# Patient Record
Sex: Male | Born: 1950 | Race: White | Hispanic: No | Marital: Married | Smoking: Former smoker
Health system: Southern US, Community
[De-identification: ages and names within clinical notes are randomized; demographics above are authoritative.]

## PROBLEM LIST (undated history)

## (undated) DIAGNOSIS — K219 Gastro-esophageal reflux disease without esophagitis: Secondary | ICD-10-CM

## (undated) DIAGNOSIS — E079 Disorder of thyroid, unspecified: Secondary | ICD-10-CM

## (undated) DIAGNOSIS — C3490 Malignant neoplasm of unspecified part of unspecified bronchus or lung: Secondary | ICD-10-CM

## (undated) DIAGNOSIS — I714 Abdominal aortic aneurysm, without rupture, unspecified: Secondary | ICD-10-CM

## (undated) DIAGNOSIS — J449 Chronic obstructive pulmonary disease, unspecified: Secondary | ICD-10-CM

## (undated) DIAGNOSIS — R972 Elevated prostate specific antigen [PSA]: Secondary | ICD-10-CM

## (undated) HISTORY — PX: HERNIA REPAIR: SHX51

## (undated) HISTORY — PX: LUNG CANCER SURGERY: SHX702

## (undated) MED ORDER — SPIRONOLACTONE 25 MG OR TABS
ORAL_TABLET | ORAL | 0 refills | Status: AC
Start: 2016-06-24 — End: ?

## (undated) MED ORDER — WARFARIN SODIUM 5 MG OR TABS
ORAL_TABLET | ORAL | Status: AC
Start: 2013-07-04 — End: ?

## (undated) MED ORDER — LISINOPRIL 20 MG OR TABS
ORAL_TABLET | ORAL | 0 refills | Status: AC
Start: 2016-06-24 — End: ?

## (undated) MED ORDER — LEVOTHYROXINE SODIUM 100 MCG OR TABS
100.0000 ug | ORAL_TABLET | Freq: Every day | ORAL | Status: AC
Start: 2017-10-15 — End: ?

## (undated) MED ORDER — XARELTO 20 MG OR TABS
ORAL_TABLET | ORAL | Status: AC
Start: 2015-08-07 — End: ?

## (undated) MED ORDER — LEVOTHYROXINE SODIUM 100 MCG OR TABS
ORAL_TABLET | ORAL | 0 refills | Status: AC
Start: 2016-10-22 — End: ?

## (undated) MED ORDER — AMIODARONE HCL 200 MG OR TABS
ORAL_TABLET | ORAL | 0 refills | Status: AC
Start: 2016-06-24 — End: ?

## (undated) MED ORDER — FUROSEMIDE 40 MG OR TABS
ORAL_TABLET | ORAL | Status: AC
Start: 2013-07-11 — End: ?

## (undated) MED ORDER — XARELTO 20 MG OR TABS
ORAL_TABLET | ORAL | 0 refills | Status: AC
Start: 2016-08-19 — End: ?

## (undated) MED ORDER — SPIRONOLACTONE 25 MG OR TABS
ORAL_TABLET | ORAL | 0 refills | Status: AC
Start: 2016-08-19 — End: ?

## (undated) MED ORDER — WARFARIN SODIUM 5 MG OR TABS
ORAL_TABLET | ORAL | Status: AC
Start: 2014-02-23 — End: ?

---

## 2007-06-12 ENCOUNTER — Ambulatory Visit (EMERGENCY_DEPARTMENT_HOSPITAL): Payer: Self-pay

## 2007-06-12 ENCOUNTER — Encounter (HOSPITAL_COMMUNITY): Payer: Self-pay

## 2007-06-12 DIAGNOSIS — J984 Other disorders of lung: Secondary | ICD-10-CM

## 2007-06-12 DIAGNOSIS — R222 Localized swelling, mass and lump, trunk: Secondary | ICD-10-CM

## 2007-06-12 DIAGNOSIS — I871 Compression of vein: Secondary | ICD-10-CM

## 2007-06-12 DIAGNOSIS — R0602 Shortness of breath: Secondary | ICD-10-CM

## 2007-06-12 DIAGNOSIS — R079 Chest pain, unspecified: Secondary | ICD-10-CM

## 2007-06-12 DIAGNOSIS — Z87891 Personal history of nicotine dependence: Secondary | ICD-10-CM

## 2007-06-12 DIAGNOSIS — J90 Pleural effusion, not elsewhere classified: Secondary | ICD-10-CM

## 2007-06-13 DIAGNOSIS — R222 Localized swelling, mass and lump, trunk: Secondary | ICD-10-CM

## 2007-06-15 ENCOUNTER — Other Ambulatory Visit (HOSPITAL_BASED_OUTPATIENT_CLINIC_OR_DEPARTMENT_OTHER): Payer: Self-pay | Admitting: Surgery

## 2007-06-15 DIAGNOSIS — C8589 Other specified types of non-Hodgkin lymphoma, extranodal and solid organ sites: Secondary | ICD-10-CM

## 2007-06-16 ENCOUNTER — Ambulatory Visit (HOSPITAL_BASED_OUTPATIENT_CLINIC_OR_DEPARTMENT_OTHER): Payer: Self-pay

## 2007-06-16 DIAGNOSIS — R222 Localized swelling, mass and lump, trunk: Secondary | ICD-10-CM

## 2007-06-16 DIAGNOSIS — R599 Enlarged lymph nodes, unspecified: Secondary | ICD-10-CM

## 2007-06-17 ENCOUNTER — Ambulatory Visit (EMERGENCY_DEPARTMENT_HOSPITAL): Payer: Medicaid Other

## 2007-06-17 ENCOUNTER — Encounter (HOSPITAL_COMMUNITY): Payer: Medicaid Other | Admitting: Infectious Disease

## 2007-06-17 DIAGNOSIS — C8589 Other specified types of non-Hodgkin lymphoma, extranodal and solid organ sites: Secondary | ICD-10-CM

## 2007-06-17 DIAGNOSIS — I871 Compression of vein: Secondary | ICD-10-CM

## 2007-06-18 ENCOUNTER — Other Ambulatory Visit (HOSPITAL_BASED_OUTPATIENT_CLINIC_OR_DEPARTMENT_OTHER): Payer: Self-pay | Admitting: Hematology & Oncology

## 2007-06-18 DIAGNOSIS — C8589 Other specified types of non-Hodgkin lymphoma, extranodal and solid organ sites: Secondary | ICD-10-CM

## 2007-06-18 DIAGNOSIS — C8582 Other specified types of non-Hodgkin lymphoma, intrathoracic lymph nodes: Secondary | ICD-10-CM

## 2007-06-20 LAB — PATHOLOGY, SURGICAL

## 2007-06-22 ENCOUNTER — Encounter (HOSPITAL_BASED_OUTPATIENT_CLINIC_OR_DEPARTMENT_OTHER): Payer: Self-pay | Admitting: Radiation Oncology

## 2007-06-22 ENCOUNTER — Encounter (HOSPITAL_BASED_OUTPATIENT_CLINIC_OR_DEPARTMENT_OTHER): Payer: Self-pay | Admitting: Surgery

## 2007-06-22 DIAGNOSIS — C8589 Other specified types of non-Hodgkin lymphoma, extranodal and solid organ sites: Secondary | ICD-10-CM

## 2007-06-23 LAB — PATHOLOGY, SURGICAL

## 2007-06-25 ENCOUNTER — Encounter (HOSPITAL_BASED_OUTPATIENT_CLINIC_OR_DEPARTMENT_OTHER): Payer: Self-pay | Admitting: Surgery

## 2007-06-25 DIAGNOSIS — R222 Localized swelling, mass and lump, trunk: Secondary | ICD-10-CM

## 2007-06-25 LAB — CYTOGENETICS

## 2007-06-28 ENCOUNTER — Other Ambulatory Visit (HOSPITAL_BASED_OUTPATIENT_CLINIC_OR_DEPARTMENT_OTHER): Payer: Self-pay | Admitting: Unknown Physician Specialty

## 2007-06-30 ENCOUNTER — Encounter (HOSPITAL_BASED_OUTPATIENT_CLINIC_OR_DEPARTMENT_OTHER): Payer: Medicaid Other | Admitting: Hematology & Oncology

## 2007-06-30 DIAGNOSIS — C8589 Other specified types of non-Hodgkin lymphoma, extranodal and solid organ sites: Secondary | ICD-10-CM

## 2007-07-09 ENCOUNTER — Inpatient Hospital Stay (HOSPITAL_BASED_OUTPATIENT_CLINIC_OR_DEPARTMENT_OTHER): Payer: Self-pay

## 2007-07-19 ENCOUNTER — Other Ambulatory Visit (HOSPITAL_BASED_OUTPATIENT_CLINIC_OR_DEPARTMENT_OTHER): Payer: Self-pay

## 2007-07-29 ENCOUNTER — Encounter (HOSPITAL_BASED_OUTPATIENT_CLINIC_OR_DEPARTMENT_OTHER): Payer: Medicaid Other | Admitting: Hematology & Oncology

## 2007-07-29 DIAGNOSIS — C8589 Other specified types of non-Hodgkin lymphoma, extranodal and solid organ sites: Secondary | ICD-10-CM

## 2007-07-30 ENCOUNTER — Inpatient Hospital Stay (HOSPITAL_BASED_OUTPATIENT_CLINIC_OR_DEPARTMENT_OTHER): Payer: Self-pay

## 2007-08-09 ENCOUNTER — Other Ambulatory Visit (HOSPITAL_BASED_OUTPATIENT_CLINIC_OR_DEPARTMENT_OTHER): Payer: Self-pay | Admitting: Unknown Physician Specialty

## 2007-08-10 ENCOUNTER — Other Ambulatory Visit (HOSPITAL_BASED_OUTPATIENT_CLINIC_OR_DEPARTMENT_OTHER): Payer: Self-pay

## 2007-08-12 ENCOUNTER — Encounter (HOSPITAL_BASED_OUTPATIENT_CLINIC_OR_DEPARTMENT_OTHER): Payer: Medicaid Other | Admitting: Hematology & Oncology

## 2007-08-12 ENCOUNTER — Other Ambulatory Visit (HOSPITAL_BASED_OUTPATIENT_CLINIC_OR_DEPARTMENT_OTHER): Payer: Self-pay

## 2007-08-12 DIAGNOSIS — C8589 Other specified types of non-Hodgkin lymphoma, extranodal and solid organ sites: Secondary | ICD-10-CM

## 2007-08-17 ENCOUNTER — Other Ambulatory Visit (HOSPITAL_BASED_OUTPATIENT_CLINIC_OR_DEPARTMENT_OTHER): Payer: Self-pay | Admitting: Unknown Physician Specialty

## 2007-08-20 ENCOUNTER — Inpatient Hospital Stay (HOSPITAL_BASED_OUTPATIENT_CLINIC_OR_DEPARTMENT_OTHER): Payer: Self-pay

## 2007-08-27 ENCOUNTER — Other Ambulatory Visit (HOSPITAL_BASED_OUTPATIENT_CLINIC_OR_DEPARTMENT_OTHER): Payer: Self-pay

## 2007-08-30 ENCOUNTER — Other Ambulatory Visit (HOSPITAL_BASED_OUTPATIENT_CLINIC_OR_DEPARTMENT_OTHER): Payer: Self-pay

## 2007-09-02 ENCOUNTER — Encounter (HOSPITAL_BASED_OUTPATIENT_CLINIC_OR_DEPARTMENT_OTHER): Payer: Medicaid Other | Admitting: Hematology & Oncology

## 2007-09-02 ENCOUNTER — Other Ambulatory Visit (HOSPITAL_BASED_OUTPATIENT_CLINIC_OR_DEPARTMENT_OTHER): Payer: Self-pay

## 2007-09-02 DIAGNOSIS — C8589 Other specified types of non-Hodgkin lymphoma, extranodal and solid organ sites: Secondary | ICD-10-CM

## 2007-09-08 ENCOUNTER — Ambulatory Visit: Payer: Medicaid Other

## 2007-09-08 DIAGNOSIS — C8589 Other specified types of non-Hodgkin lymphoma, extranodal and solid organ sites: Secondary | ICD-10-CM

## 2007-09-10 ENCOUNTER — Inpatient Hospital Stay (HOSPITAL_BASED_OUTPATIENT_CLINIC_OR_DEPARTMENT_OTHER): Payer: Medicaid Other

## 2007-09-20 ENCOUNTER — Other Ambulatory Visit (HOSPITAL_BASED_OUTPATIENT_CLINIC_OR_DEPARTMENT_OTHER): Payer: Self-pay

## 2007-09-23 ENCOUNTER — Encounter (HOSPITAL_BASED_OUTPATIENT_CLINIC_OR_DEPARTMENT_OTHER): Payer: Medicaid Other | Admitting: Hematology & Oncology

## 2007-09-23 ENCOUNTER — Other Ambulatory Visit (HOSPITAL_BASED_OUTPATIENT_CLINIC_OR_DEPARTMENT_OTHER): Payer: Self-pay

## 2007-09-23 DIAGNOSIS — C8588 Other specified types of non-Hodgkin lymphoma, lymph nodes of multiple sites: Secondary | ICD-10-CM

## 2007-10-01 ENCOUNTER — Inpatient Hospital Stay (HOSPITAL_BASED_OUTPATIENT_CLINIC_OR_DEPARTMENT_OTHER): Payer: Medicaid Other

## 2007-10-14 ENCOUNTER — Other Ambulatory Visit (HOSPITAL_BASED_OUTPATIENT_CLINIC_OR_DEPARTMENT_OTHER): Payer: Medicaid Other

## 2007-10-19 ENCOUNTER — Encounter (HOSPITAL_BASED_OUTPATIENT_CLINIC_OR_DEPARTMENT_OTHER): Payer: Medicaid Other | Admitting: Radiation Oncology

## 2007-10-19 ENCOUNTER — Encounter (HOSPITAL_BASED_OUTPATIENT_CLINIC_OR_DEPARTMENT_OTHER): Payer: Self-pay | Admitting: Radiation Oncology

## 2007-10-19 ENCOUNTER — Encounter (HOSPITAL_BASED_OUTPATIENT_CLINIC_OR_DEPARTMENT_OTHER): Payer: Medicaid Other

## 2007-10-19 DIAGNOSIS — C8589 Other specified types of non-Hodgkin lymphoma, extranodal and solid organ sites: Secondary | ICD-10-CM

## 2007-10-21 LAB — CT THERAPY PLAN RAD ONC BODY

## 2007-10-25 ENCOUNTER — Other Ambulatory Visit (HOSPITAL_BASED_OUTPATIENT_CLINIC_OR_DEPARTMENT_OTHER): Payer: Medicaid Other

## 2007-10-27 ENCOUNTER — Other Ambulatory Visit (HOSPITAL_BASED_OUTPATIENT_CLINIC_OR_DEPARTMENT_OTHER): Payer: Medicaid Other

## 2007-10-27 DIAGNOSIS — R222 Localized swelling, mass and lump, trunk: Secondary | ICD-10-CM

## 2007-10-27 DIAGNOSIS — N289 Disorder of kidney and ureter, unspecified: Secondary | ICD-10-CM

## 2007-10-27 DIAGNOSIS — C8589 Other specified types of non-Hodgkin lymphoma, extranodal and solid organ sites: Secondary | ICD-10-CM

## 2007-11-01 DIAGNOSIS — C8589 Other specified types of non-Hodgkin lymphoma, extranodal and solid organ sites: Secondary | ICD-10-CM

## 2007-11-02 ENCOUNTER — Encounter (HOSPITAL_BASED_OUTPATIENT_CLINIC_OR_DEPARTMENT_OTHER): Payer: Medicaid Other

## 2007-11-03 ENCOUNTER — Ambulatory Visit (HOSPITAL_BASED_OUTPATIENT_CLINIC_OR_DEPARTMENT_OTHER): Payer: Medicaid Other

## 2007-11-03 DIAGNOSIS — C8589 Other specified types of non-Hodgkin lymphoma, extranodal and solid organ sites: Secondary | ICD-10-CM

## 2007-11-04 ENCOUNTER — Ambulatory Visit (HOSPITAL_BASED_OUTPATIENT_CLINIC_OR_DEPARTMENT_OTHER): Payer: Medicaid Other

## 2007-11-05 ENCOUNTER — Encounter (HOSPITAL_BASED_OUTPATIENT_CLINIC_OR_DEPARTMENT_OTHER): Payer: Medicaid Other

## 2007-11-05 ENCOUNTER — Ambulatory Visit (HOSPITAL_BASED_OUTPATIENT_CLINIC_OR_DEPARTMENT_OTHER): Payer: Medicaid Other

## 2007-11-08 ENCOUNTER — Ambulatory Visit (HOSPITAL_BASED_OUTPATIENT_CLINIC_OR_DEPARTMENT_OTHER): Payer: Medicaid Other

## 2007-11-09 ENCOUNTER — Ambulatory Visit (HOSPITAL_BASED_OUTPATIENT_CLINIC_OR_DEPARTMENT_OTHER): Payer: Medicaid Other

## 2007-11-10 ENCOUNTER — Ambulatory Visit (HOSPITAL_BASED_OUTPATIENT_CLINIC_OR_DEPARTMENT_OTHER): Payer: Medicaid Other

## 2007-11-10 DIAGNOSIS — C8589 Other specified types of non-Hodgkin lymphoma, extranodal and solid organ sites: Secondary | ICD-10-CM

## 2007-11-11 ENCOUNTER — Ambulatory Visit (HOSPITAL_BASED_OUTPATIENT_CLINIC_OR_DEPARTMENT_OTHER): Payer: Medicaid Other

## 2007-11-12 ENCOUNTER — Encounter (HOSPITAL_BASED_OUTPATIENT_CLINIC_OR_DEPARTMENT_OTHER): Payer: Medicaid Other

## 2007-11-12 ENCOUNTER — Ambulatory Visit (HOSPITAL_BASED_OUTPATIENT_CLINIC_OR_DEPARTMENT_OTHER): Payer: Medicaid Other

## 2007-11-15 ENCOUNTER — Ambulatory Visit (HOSPITAL_BASED_OUTPATIENT_CLINIC_OR_DEPARTMENT_OTHER): Payer: Medicaid Other

## 2007-11-16 ENCOUNTER — Ambulatory Visit (HOSPITAL_BASED_OUTPATIENT_CLINIC_OR_DEPARTMENT_OTHER): Payer: Medicaid Other

## 2007-11-17 ENCOUNTER — Ambulatory Visit (HOSPITAL_BASED_OUTPATIENT_CLINIC_OR_DEPARTMENT_OTHER): Payer: Medicaid Other

## 2007-11-17 DIAGNOSIS — C8589 Other specified types of non-Hodgkin lymphoma, extranodal and solid organ sites: Secondary | ICD-10-CM

## 2007-11-18 ENCOUNTER — Ambulatory Visit (HOSPITAL_BASED_OUTPATIENT_CLINIC_OR_DEPARTMENT_OTHER): Payer: Medicaid Other

## 2007-11-19 ENCOUNTER — Encounter (HOSPITAL_BASED_OUTPATIENT_CLINIC_OR_DEPARTMENT_OTHER): Payer: Medicaid Other

## 2007-11-19 ENCOUNTER — Ambulatory Visit (HOSPITAL_BASED_OUTPATIENT_CLINIC_OR_DEPARTMENT_OTHER): Payer: Medicaid Other

## 2007-11-22 ENCOUNTER — Ambulatory Visit (HOSPITAL_BASED_OUTPATIENT_CLINIC_OR_DEPARTMENT_OTHER): Payer: Medicaid Other

## 2007-11-23 ENCOUNTER — Ambulatory Visit (HOSPITAL_BASED_OUTPATIENT_CLINIC_OR_DEPARTMENT_OTHER): Payer: Medicaid Other

## 2007-11-24 ENCOUNTER — Ambulatory Visit (HOSPITAL_BASED_OUTPATIENT_CLINIC_OR_DEPARTMENT_OTHER): Payer: Medicaid Other

## 2007-11-24 DIAGNOSIS — C8589 Other specified types of non-Hodgkin lymphoma, extranodal and solid organ sites: Secondary | ICD-10-CM

## 2007-11-25 ENCOUNTER — Ambulatory Visit (HOSPITAL_BASED_OUTPATIENT_CLINIC_OR_DEPARTMENT_OTHER): Payer: Medicaid Other

## 2007-11-26 ENCOUNTER — Ambulatory Visit (HOSPITAL_BASED_OUTPATIENT_CLINIC_OR_DEPARTMENT_OTHER): Payer: Medicaid Other

## 2007-11-26 ENCOUNTER — Encounter (HOSPITAL_BASED_OUTPATIENT_CLINIC_OR_DEPARTMENT_OTHER): Payer: Medicaid Other

## 2007-11-30 ENCOUNTER — Ambulatory Visit (HOSPITAL_BASED_OUTPATIENT_CLINIC_OR_DEPARTMENT_OTHER): Payer: Medicaid Other

## 2007-12-01 ENCOUNTER — Ambulatory Visit (HOSPITAL_BASED_OUTPATIENT_CLINIC_OR_DEPARTMENT_OTHER): Payer: Self-pay

## 2007-12-27 ENCOUNTER — Encounter (HOSPITAL_BASED_OUTPATIENT_CLINIC_OR_DEPARTMENT_OTHER): Payer: Self-pay | Admitting: Adult Health

## 2008-02-28 ENCOUNTER — Other Ambulatory Visit (HOSPITAL_BASED_OUTPATIENT_CLINIC_OR_DEPARTMENT_OTHER): Payer: Self-pay | Admitting: Adult Health

## 2008-02-28 ENCOUNTER — Encounter (HOSPITAL_BASED_OUTPATIENT_CLINIC_OR_DEPARTMENT_OTHER): Payer: Self-pay | Admitting: Radiation Oncology

## 2008-02-28 ENCOUNTER — Other Ambulatory Visit (HOSPITAL_BASED_OUTPATIENT_CLINIC_OR_DEPARTMENT_OTHER): Payer: Self-pay

## 2008-02-28 DIAGNOSIS — C8589 Other specified types of non-Hodgkin lymphoma, extranodal and solid organ sites: Secondary | ICD-10-CM

## 2008-02-28 DIAGNOSIS — C8582 Other specified types of non-Hodgkin lymphoma, intrathoracic lymph nodes: Secondary | ICD-10-CM

## 2008-02-28 LAB — PR DIAGNOSTIC COMPUTED TOMOGRAPHY THORAX W/CONTRAST

## 2008-02-28 LAB — PR CT SCAN OF ABDOMEN CONTRAST

## 2008-02-28 LAB — PR CT SCAN OF PELVIS CONTRAST

## 2008-03-30 ENCOUNTER — Encounter (HOSPITAL_BASED_OUTPATIENT_CLINIC_OR_DEPARTMENT_OTHER): Payer: Self-pay | Admitting: Hematology & Oncology

## 2008-03-30 DIAGNOSIS — C8589 Other specified types of non-Hodgkin lymphoma, extranodal and solid organ sites: Secondary | ICD-10-CM

## 2008-06-22 ENCOUNTER — Encounter (HOSPITAL_BASED_OUTPATIENT_CLINIC_OR_DEPARTMENT_OTHER): Payer: Self-pay | Admitting: Hematology & Oncology

## 2008-06-22 DIAGNOSIS — C8589 Other specified types of non-Hodgkin lymphoma, extranodal and solid organ sites: Secondary | ICD-10-CM

## 2008-08-30 ENCOUNTER — Encounter (HOSPITAL_BASED_OUTPATIENT_CLINIC_OR_DEPARTMENT_OTHER): Payer: Self-pay | Admitting: Radiation Oncology

## 2008-10-05 ENCOUNTER — Encounter (HOSPITAL_BASED_OUTPATIENT_CLINIC_OR_DEPARTMENT_OTHER): Payer: Self-pay | Admitting: Hematology & Oncology

## 2012-02-14 ENCOUNTER — Inpatient Hospital Stay (HOSPITAL_BASED_OUTPATIENT_CLINIC_OR_DEPARTMENT_OTHER)
Admission: EM | Admit: 2012-02-14 | Discharge: 2012-02-16 | DRG: 138 | Disposition: A | Discharge status: Home / Self Care | Payer: Self-pay | Attending: Internal Medicine | Admitting: Internal Medicine

## 2012-02-14 ENCOUNTER — Other Ambulatory Visit (EMERGENCY_DEPARTMENT_HOSPITAL): Payer: Self-pay | Admitting: Family

## 2012-02-14 ENCOUNTER — Inpatient Hospital Stay (HOSPITAL_COMMUNITY): Payer: Self-pay | Admitting: Internal Medicine

## 2012-02-14 DIAGNOSIS — F1010 Alcohol abuse, uncomplicated: Secondary | ICD-10-CM | POA: Diagnosis present

## 2012-02-14 DIAGNOSIS — Z923 Personal history of irradiation: Secondary | ICD-10-CM

## 2012-02-14 DIAGNOSIS — Z9221 Personal history of antineoplastic chemotherapy: Secondary | ICD-10-CM

## 2012-02-14 DIAGNOSIS — I4891 Unspecified atrial fibrillation: Secondary | ICD-10-CM

## 2012-02-14 DIAGNOSIS — C8582 Other specified types of non-Hodgkin lymphoma, intrathoracic lymph nodes: Secondary | ICD-10-CM | POA: Diagnosis present

## 2012-02-14 DIAGNOSIS — Z87891 Personal history of nicotine dependence: Secondary | ICD-10-CM

## 2012-02-14 DIAGNOSIS — R0602 Shortness of breath: Secondary | ICD-10-CM | POA: Diagnosis present

## 2012-02-14 LAB — BASIC METABOLIC PANEL
Anion Gap: 10 (ref 3–11)
Calcium: 9 mg/dL (ref 8.9–10.2)
Carbon Dioxide, Total: 26 mEq/L (ref 22–32)
Chloride: 102 mEq/L (ref 98–108)
Creatinine: 1 mg/dL (ref 0.51–1.18)
GFR, Calc, African American: >60 mL/min (ref >59)
GFR, Calc, European American: >60 mL/min (ref >59)
Glucose: 102 mg/dL (ref 62–125)
Potassium: 3.6 mEq/L — ABNORMAL LOW (ref 3.7–5.2)
Sodium: 138 mEq/L (ref 136–145)
Urea Nitrogen: 18 mg/dL (ref 8–21)

## 2012-02-14 LAB — CBC (HEMOGRAM)
Hematocrit: 41 % (ref 38–50)
Hemoglobin: 14 g/dL (ref 13.0–18.0)
MCH: 34.1 pg — ABNORMAL HIGH (ref 27.3–33.6)
MCHC: 34.4 g/dL (ref 32.2–36.5)
MCV: 99 fL — ABNORMAL HIGH (ref 81–98)
Platelet Count: 201 10*3/uL (ref 150–400)
RBC: 4.1 mil/uL — ABNORMAL LOW (ref 4.40–5.60)
RDW-CV: 13.4 % (ref 11.6–14.4)
WBC: 10.86 10*3/uL — ABNORMAL HIGH (ref 4.30–10.00)

## 2012-02-14 LAB — CHEST PAIN REFLEXIVE TESTING: Troponin_I: 0.03 ng/mL (ref 0.00–0.39)

## 2012-02-14 LAB — B_TYPE NATRIURETIC PEPTIDE

## 2012-02-14 LAB — 1ST EXTRA BLUE TOP

## 2012-02-15 ENCOUNTER — Other Ambulatory Visit (HOSPITAL_COMMUNITY): Payer: Self-pay

## 2012-02-15 ENCOUNTER — Other Ambulatory Visit (HOSPITAL_COMMUNITY): Payer: Self-pay | Admitting: Critical Care Medicine

## 2012-02-15 DIAGNOSIS — R0602 Shortness of breath: Secondary | ICD-10-CM

## 2012-02-15 DIAGNOSIS — Z7901 Long term (current) use of anticoagulants: Secondary | ICD-10-CM

## 2012-02-15 DIAGNOSIS — I4891 Unspecified atrial fibrillation: Secondary | ICD-10-CM

## 2012-02-15 DIAGNOSIS — C8589 Other specified types of non-Hodgkin lymphoma, extranodal and solid organ sites: Secondary | ICD-10-CM

## 2012-02-15 LAB — PARTIAL THROMBOPLASTIN TIME
Partial Thromboplastin Time: 29 s (ref 22–35)
Partial Thromboplastin Time: 45 s — ABNORMAL HIGH (ref 22–35)

## 2012-02-15 LAB — BASIC METABOLIC PANEL
Anion Gap: 8 (ref 3–11)
Calcium: 8.6 mg/dL — ABNORMAL LOW (ref 8.9–10.2)
Carbon Dioxide, Total: 27 mEq/L (ref 22–32)
Chloride: 103 mEq/L (ref 98–108)
Creatinine: 0.85 mg/dL (ref 0.51–1.18)
GFR, Calc, African American: >60 mL/min (ref >59)
GFR, Calc, European American: >60 mL/min (ref >59)
Glucose: 113 mg/dL (ref 62–125)
Potassium: 3.7 mEq/L (ref 3.7–5.2)
Sodium: 138 mEq/L (ref 136–145)
Urea Nitrogen: 14 mg/dL (ref 8–21)

## 2012-02-15 LAB — CBC (HEMOGRAM)
Hematocrit: 39 % (ref 38–50)
Hemoglobin: 13.2 g/dL (ref 13.0–18.0)
MCH: 33.2 pg (ref 27.3–33.6)
MCHC: 34.1 g/dL (ref 32.2–36.5)
MCV: 98 fL (ref 81–98)
Platelet Count: 179 10*3/uL (ref 150–400)
RBC: 3.97 mil/uL — ABNORMAL LOW (ref 4.40–5.60)
RDW-CV: 13.4 % (ref 11.6–14.4)
WBC: 9.37 10*3/uL (ref 4.30–10.00)

## 2012-02-15 LAB — LAB ORDER, MICROBIOLOGY

## 2012-02-15 LAB — HEPATIC FUNCTION PANEL
ALT (GPT): 51 U/L — ABNORMAL HIGH (ref 10–48)
AST (GOT): 31 U/L (ref 15–40)
Albumin: 3.7 g/dL (ref 3.5–5.2)
Albumin: 3.7 g/dL (ref 3.5–5.2)
Alkaline Phosphatase (Total): 72 U/L (ref 37–159)
Bilirubin (Direct): 0.2 mg/dL (ref 0.0–0.3)
Bilirubin (Total): 1.5 mg/dL — ABNORMAL HIGH (ref 0.2–1.3)
Protein (Total): 7 g/dL (ref 6.0–8.2)
Protein (Total): 7 g/dL (ref 6.0–8.2)

## 2012-02-15 LAB — THYROID STIMULATING HORMONE: Thyroid Stimulating Hormone: 4.172 u[IU]/mL (ref 0.400–5.000)

## 2012-02-15 LAB — CHEST PAIN REFLEXIVE TESTING: Troponin_I: 0.03 ng/mL (ref 0.00–0.39)

## 2012-02-15 LAB — LIPID PANEL
Cholesterol (LDL): 123 mg/dL (ref <130)
Cholesterol/HDL Ratio: 3
HDL Cholesterol: 68 mg/dL (ref >40)
Non-HDL Cholesterol: 134 mg/dL (ref 0–159)
Total Cholesterol: 202 mg/dL — ABNORMAL HIGH (ref <200)
Triglyceride: 55 mg/dL (ref <150)

## 2012-02-15 LAB — PROTHROMBIN TIME
Prothrombin INR: 1.1 (ref 0.8–1.3)
Prothrombin INR: 1.2 (ref 0.8–1.3)
Prothrombin Time Patient: 13.7 s (ref 10.7–15.6)
Prothrombin Time Patient: 14.6 s (ref 10.7–15.6)

## 2012-02-15 LAB — MAGNESIUM: Magnesium: 1.9 mg/dL (ref 1.8–2.4)

## 2012-02-15 LAB — B_TYPE NATRIURETIC PEPTIDE: B_Type Natriuretic Peptide: 772 pg/mL — ABNORMAL HIGH (ref <101)

## 2012-02-15 LAB — 1ST EXTRA LIME GREEN TOP

## 2012-02-16 ENCOUNTER — Other Ambulatory Visit (HOSPITAL_COMMUNITY): Payer: Self-pay | Admitting: Internal Medicine

## 2012-02-16 ENCOUNTER — Other Ambulatory Visit (HOSPITAL_COMMUNITY): Payer: Self-pay

## 2012-02-16 LAB — 1ST EXTRA LAVENDER TOP

## 2012-02-16 LAB — R/O ACINETOBACTER CULTURE

## 2012-02-16 LAB — VANCO-RESIST ENTEROCOCCUS C/S

## 2012-02-16 LAB — R/O MRSA

## 2012-02-16 LAB — BASIC METABOLIC PANEL
Anion Gap: 5 (ref 3–11)
Calcium: 8.6 mg/dL — ABNORMAL LOW (ref 8.9–10.2)
Carbon Dioxide, Total: 26 mEq/L (ref 22–32)
Chloride: 105 mEq/L (ref 98–108)
Creatinine: 0.87 mg/dL (ref 0.51–1.18)
GFR, Calc, African American: >60 mL/min (ref >59)
GFR, Calc, European American: >60 mL/min (ref >59)
Glucose: 108 mg/dL (ref 62–125)
Potassium: 3.8 mEq/L (ref 3.7–5.2)
Sodium: 136 mEq/L (ref 136–145)
Urea Nitrogen: 11 mg/dL (ref 8–21)

## 2012-02-17 LAB — VANCO-RESIST ENTEROCOCCUS C/S

## 2012-02-17 LAB — R/O ACINETOBACTER CULTURE

## 2012-02-20 ENCOUNTER — Emergency Department (HOSPITAL_BASED_OUTPATIENT_CLINIC_OR_DEPARTMENT_OTHER)
Admission: EM | Admit: 2012-02-20 | Discharge: 2012-02-20 | Disposition: A | Discharge status: Home / Self Care | Payer: Self-pay | Attending: Emergency Medicine | Admitting: Emergency Medicine

## 2012-02-20 ENCOUNTER — Other Ambulatory Visit (EMERGENCY_DEPARTMENT_HOSPITAL): Payer: Self-pay

## 2012-02-20 DIAGNOSIS — Z7901 Long term (current) use of anticoagulants: Secondary | ICD-10-CM | POA: Insufficient documentation

## 2012-02-20 DIAGNOSIS — I4891 Unspecified atrial fibrillation: Secondary | ICD-10-CM

## 2012-02-20 LAB — PROTHROMBIN TIME
Prothrombin INR: 1.5 — ABNORMAL HIGH (ref 0.8–1.3)
Prothrombin Time Patient: 17.6 s — ABNORMAL HIGH (ref 10.7–15.6)

## 2012-04-03 ENCOUNTER — Inpatient Hospital Stay (HOSPITAL_BASED_OUTPATIENT_CLINIC_OR_DEPARTMENT_OTHER)
Admission: EM | Admit: 2012-04-03 | Discharge: 2012-04-04 | DRG: 139 | Disposition: A | Discharge status: Home / Self Care | Payer: Self-pay | Attending: Internal Medicine | Admitting: Internal Medicine

## 2012-04-03 ENCOUNTER — Other Ambulatory Visit (EMERGENCY_DEPARTMENT_HOSPITAL): Payer: Self-pay

## 2012-04-03 ENCOUNTER — Inpatient Hospital Stay (HOSPITAL_COMMUNITY): Payer: Self-pay | Admitting: Internal Medicine

## 2012-04-03 DIAGNOSIS — Z9119 Patient's noncompliance with other medical treatment and regimen: Secondary | ICD-10-CM

## 2012-04-03 DIAGNOSIS — R0602 Shortness of breath: Secondary | ICD-10-CM

## 2012-04-03 DIAGNOSIS — E669 Obesity, unspecified: Secondary | ICD-10-CM | POA: Diagnosis present

## 2012-04-03 DIAGNOSIS — I4891 Unspecified atrial fibrillation: Secondary | ICD-10-CM

## 2012-04-03 DIAGNOSIS — R000 Tachycardia, unspecified: Secondary | ICD-10-CM

## 2012-04-03 DIAGNOSIS — Z87898 Personal history of other specified conditions: Secondary | ICD-10-CM

## 2012-04-03 DIAGNOSIS — Z7901 Long term (current) use of anticoagulants: Secondary | ICD-10-CM

## 2012-04-03 DIAGNOSIS — Z87891 Personal history of nicotine dependence: Secondary | ICD-10-CM

## 2012-04-03 DIAGNOSIS — R791 Abnormal coagulation profile: Secondary | ICD-10-CM

## 2012-04-03 DIAGNOSIS — F1010 Alcohol abuse, uncomplicated: Secondary | ICD-10-CM | POA: Diagnosis present

## 2012-04-03 DIAGNOSIS — Z6826 Body mass index (BMI) 26.0-26.9, adult: Secondary | ICD-10-CM

## 2012-04-03 DIAGNOSIS — Z91199 Patient's noncompliance with other medical treatment and regimen due to unspecified reason: Secondary | ICD-10-CM

## 2012-04-03 LAB — CBC, DIFF
% Basophils: 0 %
% Eosinophils: 3 %
% Immature Granulocytes: 0 %
% Lymphocytes: 25 %
% Monocytes: 12 %
% Neutrophils: 60 %
Absolute Eosinophil Count: 0.25 10*3/uL (ref 0.00–0.50)
Absolute Lymphocyte Count: 2.38 10*3/uL (ref 1.00–4.80)
Basophils: 0.04 10*3/uL (ref 0.00–0.20)
Hematocrit: 41 % (ref 38–50)
Hemoglobin: 13.9 g/dL (ref 13.0–18.0)
Immature Granulocytes: 0.03 10*3/uL (ref 0.00–0.05)
MCH: 32.9 pg (ref 27.3–33.6)
MCHC: 34.2 g/dL (ref 32.2–36.5)
MCV: 96 fL (ref 81–98)
Monocytes: 1.13 10*3/uL — ABNORMAL HIGH (ref 0.00–0.80)
Neutrophils: 5.71 10*3/uL (ref 1.80–7.00)
Platelet Count: 196 10*3/uL (ref 150–400)
RBC: 4.22 mil/uL — ABNORMAL LOW (ref 4.40–5.60)
RDW-CV: 13.3 % (ref 11.6–14.4)
WBC: 9.54 10*3/uL (ref 4.30–10.00)

## 2012-04-03 LAB — CHEST PAIN REFLEXIVE TESTING: Troponin_I: 0.03 ng/mL (ref 0.00–0.39)

## 2012-04-03 LAB — LAB ADD ON ORDER

## 2012-04-03 LAB — COMPREHENSIVE METABOLIC PANEL
ALT (GPT): 42 U/L (ref 10–48)
AST (GOT): 27 U/L (ref 15–40)
Albumin: 3.9 g/dL (ref 3.5–5.2)
Alkaline Phosphatase (Total): 69 U/L (ref 37–159)
Anion Gap: 8 (ref 3–11)
Bilirubin (Total): 0.9 mg/dL (ref 0.2–1.3)
Calcium: 9.1 mg/dL (ref 8.9–10.2)
Carbon Dioxide, Total: 26 mEq/L (ref 22–32)
Chloride: 104 mEq/L (ref 98–108)
Creatinine: 0.98 mg/dL (ref 0.51–1.18)
GFR, Calc, African American: >60 mL/min (ref >59)
GFR, Calc, European American: >60 mL/min (ref >59)
Glucose: 115 mg/dL (ref 62–125)
Potassium: 3.9 mEq/L (ref 3.7–5.2)
Protein (Total): 7.5 g/dL (ref 6.0–8.2)
Sodium: 138 mEq/L (ref 136–145)
Urea Nitrogen: 15 mg/dL (ref 8–21)

## 2012-04-03 LAB — PROTHROMBIN & PTT
Partial Thromboplastin Time: 32 s (ref 22–35)
Prothrombin INR: 1.3 (ref 0.8–1.3)
Prothrombin Time Patient: 15.6 s (ref 10.7–15.6)

## 2012-04-03 LAB — ALCOHOL (ETHYL): Alcohol (Ethyl): NEGATIVE mg/dL

## 2012-04-04 ENCOUNTER — Other Ambulatory Visit (HOSPITAL_COMMUNITY): Payer: Self-pay | Admitting: Internal Medicine

## 2012-04-04 DIAGNOSIS — R9431 Abnormal electrocardiogram [ECG] [EKG]: Secondary | ICD-10-CM

## 2012-04-04 DIAGNOSIS — F1010 Alcohol abuse, uncomplicated: Secondary | ICD-10-CM

## 2012-04-04 DIAGNOSIS — I4891 Unspecified atrial fibrillation: Secondary | ICD-10-CM

## 2012-04-04 DIAGNOSIS — R0602 Shortness of breath: Secondary | ICD-10-CM

## 2012-04-04 LAB — BASIC METABOLIC PANEL
Anion Gap: 8 (ref 3–11)
Calcium: 8.6 mg/dL — ABNORMAL LOW (ref 8.9–10.2)
Carbon Dioxide, Total: 25 mEq/L (ref 22–32)
Chloride: 107 mEq/L (ref 98–108)
Creatinine: 0.91 mg/dL (ref 0.51–1.18)
GFR, Calc, African American: >60 mL/min (ref >59)
GFR, Calc, European American: >60 mL/min (ref >59)
Glucose: 101 mg/dL (ref 62–125)
Potassium: 3.7 mEq/L (ref 3.7–5.2)
Sodium: 140 mEq/L (ref 136–145)
Urea Nitrogen: 11 mg/dL (ref 8–21)

## 2012-04-04 LAB — CBC (HEMOGRAM)
Hematocrit: 37 % — ABNORMAL LOW (ref 38–50)
Hemoglobin: 12.5 g/dL — ABNORMAL LOW (ref 13.0–18.0)
MCH: 32.8 pg (ref 27.3–33.6)
MCHC: 33.9 g/dL (ref 32.2–36.5)
MCV: 97 fL (ref 81–98)
Platelet Count: 160 10*3/uL (ref 150–400)
RBC: 3.81 mil/uL — ABNORMAL LOW (ref 4.40–5.60)
RDW-CV: 13.4 % (ref 11.6–14.4)
WBC: 6.29 10*3/uL (ref 4.30–10.00)

## 2012-04-04 LAB — TROPONIN_I
Troponin_I: 0.03 ng/mL (ref 0.00–0.39)
Troponin_I: 0.02 ng/mL (ref 0.00–0.39)

## 2012-04-04 LAB — PROTHROMBIN TIME
Prothrombin INR: 1.4 — ABNORMAL HIGH (ref 0.8–1.3)
Prothrombin Time Patient: 17.3 s — ABNORMAL HIGH (ref 10.7–15.6)

## 2012-04-05 ENCOUNTER — Other Ambulatory Visit (EMERGENCY_DEPARTMENT_HOSPITAL): Payer: Self-pay | Admitting: Emergency Medicine

## 2012-04-05 ENCOUNTER — Observation Stay (HOSPITAL_BASED_OUTPATIENT_CLINIC_OR_DEPARTMENT_OTHER)
Admission: EM | Admit: 2012-04-05 | Discharge: 2012-04-07 | Disposition: A | Discharge status: Home / Self Care | Payer: Self-pay | Attending: Internal Medicine | Admitting: Internal Medicine

## 2012-04-05 ENCOUNTER — Observation Stay (HOSPITAL_COMMUNITY): Payer: Self-pay | Admitting: Internal Medicine

## 2012-04-05 DIAGNOSIS — R222 Localized swelling, mass and lump, trunk: Secondary | ICD-10-CM

## 2012-04-05 DIAGNOSIS — J90 Pleural effusion, not elsewhere classified: Secondary | ICD-10-CM

## 2012-04-05 DIAGNOSIS — F1010 Alcohol abuse, uncomplicated: Secondary | ICD-10-CM

## 2012-04-05 DIAGNOSIS — I4891 Unspecified atrial fibrillation: Secondary | ICD-10-CM

## 2012-04-05 LAB — CBC (HEMOGRAM)
Hematocrit: 41 % (ref 38–50)
Hemoglobin: 13.7 g/dL (ref 13.0–18.0)
MCH: 32.8 pg (ref 27.3–33.6)
MCHC: 33.7 g/dL (ref 32.2–36.5)
MCV: 97 fL (ref 81–98)
Platelet Count: 196 10*3/uL (ref 150–400)
RBC: 4.18 mil/uL — ABNORMAL LOW (ref 4.40–5.60)
RDW-CV: 13.2 % (ref 11.6–14.4)
WBC: 6.99 10*3/uL (ref 4.30–10.00)

## 2012-04-05 LAB — BASIC METABOLIC PANEL
Anion Gap: 10 (ref 3–11)
Calcium: 9 mg/dL (ref 8.9–10.2)
Carbon Dioxide, Total: 25 mEq/L (ref 22–32)
Chloride: 100 mEq/L (ref 98–108)
Creatinine: 0.93 mg/dL (ref 0.51–1.18)
GFR, Calc, African American: >60 mL/min (ref >59)
GFR, Calc, European American: >60 mL/min (ref >59)
Glucose: 105 mg/dL (ref 62–125)
Potassium: 3.6 mEq/L — ABNORMAL LOW (ref 3.7–5.2)
Sodium: 135 mEq/L — ABNORMAL LOW (ref 136–145)
Urea Nitrogen: 8 mg/dL (ref 8–21)

## 2012-04-05 LAB — MAGNESIUM: Magnesium: 2.1 mg/dL (ref 1.8–2.4)

## 2012-04-05 LAB — PROTHROMBIN & PTT
Partial Thromboplastin Time: 33 s (ref 22–35)
Prothrombin INR: 1.4 — ABNORMAL HIGH (ref 0.8–1.3)
Prothrombin Time Patient: 16.6 s — ABNORMAL HIGH (ref 10.7–15.6)

## 2012-04-05 LAB — CHEST PAIN REFLEXIVE TESTING: Troponin_I: 0.02 ng/mL (ref 0.00–0.39)

## 2012-04-06 ENCOUNTER — Other Ambulatory Visit (EMERGENCY_DEPARTMENT_HOSPITAL): Payer: Self-pay | Admitting: Internal Medicine

## 2012-04-06 DIAGNOSIS — I4891 Unspecified atrial fibrillation: Secondary | ICD-10-CM

## 2012-04-06 LAB — BASIC METABOLIC PANEL
Anion Gap: 9 (ref 3–11)
Calcium: 8.6 mg/dL — ABNORMAL LOW (ref 8.9–10.2)
Carbon Dioxide, Total: 22 mEq/L (ref 22–32)
Chloride: 103 mEq/L (ref 98–108)
Creatinine: 0.92 mg/dL (ref 0.51–1.18)
GFR, Calc, African American: >60 mL/min (ref >59)
GFR, Calc, European American: >60 mL/min (ref >59)
Glucose: 93 mg/dL (ref 62–125)
Potassium: 4.5 mEq/L (ref 3.7–5.2)
Sodium: 134 mEq/L — ABNORMAL LOW (ref 136–145)
Urea Nitrogen: 8 mg/dL (ref 8–21)

## 2012-04-06 LAB — PROTHROMBIN TIME
Prothrombin INR: 1.4 — ABNORMAL HIGH (ref 0.8–1.3)
Prothrombin Time Patient: 17.4 s — ABNORMAL HIGH (ref 10.7–15.6)

## 2012-04-06 LAB — CBC (HEMOGRAM)
Hematocrit: 38 % (ref 38–50)
Hemoglobin: 12.9 g/dL — ABNORMAL LOW (ref 13.0–18.0)
MCH: 32.4 pg (ref 27.3–33.6)
MCHC: 33.9 g/dL (ref 32.2–36.5)
MCV: 96 fL (ref 81–98)
Platelet Count: 171 10*3/uL (ref 150–400)
RBC: 3.98 mil/uL — ABNORMAL LOW (ref 4.40–5.60)
RDW-CV: 12.9 % (ref 11.6–14.4)
WBC: 6.58 10*3/uL (ref 4.30–10.00)

## 2012-04-06 LAB — CK TOTAL WITH MBMASS AND QUOTIENT
CK MB Mass: 1 ng/mL (ref 0–5)
CK MB Quotient: 1 (ref 0–3)
Creatine Kinase Total Activity: 77 U/L (ref 30–285)

## 2012-04-06 LAB — BMP WITH REFLEXIVE IONIZED CA
Anion Gap: 9 (ref 3–11)
Calcium: 9 mg/dL (ref 8.9–10.2)
Carbon Dioxide, Total: 23 mEq/L (ref 22–32)
Chloride: 103 mEq/L (ref 98–108)
Creatinine: 0.93 mg/dL (ref 0.51–1.18)
GFR, Calc, African American: >60 mL/min (ref >59)
GFR, Calc, European American: >60 mL/min (ref >59)
Glucose: 110 mg/dL (ref 62–125)
Potassium: 4.1 mEq/L (ref 3.7–5.2)
Sodium: 135 mEq/L — ABNORMAL LOW (ref 136–145)
Urea Nitrogen: 9 mg/dL (ref 8–21)

## 2012-04-06 LAB — 1ST EXTRA LAVENDER TOP

## 2012-04-06 LAB — TROPONIN_I: Troponin_I: 0.02 ng/mL (ref 0.00–0.39)

## 2012-04-06 LAB — 1ST EXTRA BLUE TOP

## 2012-04-06 LAB — MAGNESIUM: Magnesium: 1.9 mg/dL (ref 1.8–2.4)

## 2012-04-07 ENCOUNTER — Other Ambulatory Visit (EMERGENCY_DEPARTMENT_HOSPITAL): Payer: Self-pay | Admitting: Internal Medicine

## 2012-04-07 DIAGNOSIS — I4892 Unspecified atrial flutter: Secondary | ICD-10-CM

## 2012-04-07 LAB — CBC (HEMOGRAM)
Hematocrit: 38 % (ref 38–50)
Hemoglobin: 13 g/dL (ref 13.0–18.0)
MCH: 32.8 pg (ref 27.3–33.6)
MCHC: 34.1 g/dL (ref 32.2–36.5)
MCV: 96 fL (ref 81–98)
Platelet Count: 184 10*3/uL (ref 150–400)
RBC: 3.96 mil/uL — ABNORMAL LOW (ref 4.40–5.60)
RDW-CV: 12.9 % (ref 11.6–14.4)
WBC: 6.56 10*3/uL (ref 4.30–10.00)

## 2012-04-07 LAB — CK TOTAL WITH MBMASS AND QUOTIENT

## 2012-04-07 LAB — MAGNESIUM

## 2012-04-07 LAB — TROPONIN_I

## 2012-04-07 LAB — PROTHROMBIN TIME
Prothrombin INR: 1.5 — ABNORMAL HIGH (ref 0.8–1.3)
Prothrombin Time Patient: 17.5 s — ABNORMAL HIGH (ref 10.7–15.6)

## 2012-05-20 ENCOUNTER — Ambulatory Visit (HOSPITAL_BASED_OUTPATIENT_CLINIC_OR_DEPARTMENT_OTHER): Payer: Self-pay | Admitting: Internal Medicine

## 2012-05-20 ENCOUNTER — Other Ambulatory Visit (HOSPITAL_BASED_OUTPATIENT_CLINIC_OR_DEPARTMENT_OTHER): Payer: Self-pay

## 2012-05-20 ENCOUNTER — Other Ambulatory Visit (EMERGENCY_DEPARTMENT_HOSPITAL): Payer: Self-pay | Admitting: Internal Medicine

## 2012-05-20 ENCOUNTER — Observation Stay (HOSPITAL_BASED_OUTPATIENT_CLINIC_OR_DEPARTMENT_OTHER)
Admission: AD | Admit: 2012-05-20 | Discharge: 2012-05-21 | Disposition: A | Discharge status: Home / Self Care | Payer: Self-pay | Source: Ambulatory Visit | Attending: Cardiovascular Disease | Admitting: Cardiovascular Disease

## 2012-05-20 ENCOUNTER — Observation Stay (HOSPITAL_COMMUNITY): Payer: Self-pay | Admitting: Cardiovascular Disease

## 2012-05-20 DIAGNOSIS — R0602 Shortness of breath: Secondary | ICD-10-CM

## 2012-05-20 DIAGNOSIS — R9431 Abnormal electrocardiogram [ECG] [EKG]: Secondary | ICD-10-CM

## 2012-05-20 DIAGNOSIS — I4891 Unspecified atrial fibrillation: Secondary | ICD-10-CM

## 2012-05-20 DIAGNOSIS — J90 Pleural effusion, not elsewhere classified: Secondary | ICD-10-CM

## 2012-05-20 LAB — BASIC METABOLIC PANEL
Anion Gap: 7 (ref 3–11)
Anion Gap: 8 (ref 3–11)
Calcium: 9.2 mg/dL (ref 8.9–10.2)
Calcium: 8.9 mg/dL (ref 8.9–10.2)
Carbon Dioxide, Total: 27 mEq/L (ref 22–32)
Carbon Dioxide, Total: 26 mEq/L (ref 22–32)
Chloride: 102 mEq/L (ref 98–108)
Chloride: 103 mEq/L (ref 98–108)
Creatinine: 0.77 mg/dL (ref 0.51–1.18)
Creatinine: 0.79 mg/dL (ref 0.51–1.18)
GFR, Calc, African American: >60 mL/min (ref >59)
GFR, Calc, African American: >60 mL/min (ref >59)
GFR, Calc, European American: >60 mL/min (ref >59)
GFR, Calc, European American: >60 mL/min (ref >59)
Glucose: 95 mg/dL (ref 62–125)
Glucose: 104 mg/dL (ref 62–125)
Potassium: 4.4 mEq/L (ref 3.7–5.2)
Potassium: 3.9 mEq/L (ref 3.7–5.2)
Sodium: 136 mEq/L (ref 136–145)
Sodium: 137 mEq/L (ref 136–145)
Urea Nitrogen: 9 mg/dL (ref 8–21)
Urea Nitrogen: 11 mg/dL (ref 8–21)

## 2012-05-20 LAB — CBC (HEMOGRAM)
Hematocrit: 36 % — ABNORMAL LOW (ref 38–50)
Hemoglobin: 11.8 g/dL — ABNORMAL LOW (ref 13.0–18.0)
MCH: 31.7 pg (ref 27.3–33.6)
MCHC: 32.7 g/dL (ref 32.2–36.5)
MCV: 97 fL (ref 81–98)
Platelet Count: 257 10*3/uL (ref 150–400)
RBC: 3.72 mil/uL — ABNORMAL LOW (ref 4.40–5.60)
RDW-CV: 14 % (ref 11.6–14.4)
WBC: 6.68 10*3/uL (ref 4.30–10.00)

## 2012-05-21 ENCOUNTER — Other Ambulatory Visit (EMERGENCY_DEPARTMENT_HOSPITAL): Payer: Self-pay | Admitting: Internal Medicine

## 2012-05-21 DIAGNOSIS — I4891 Unspecified atrial fibrillation: Secondary | ICD-10-CM

## 2012-05-21 DIAGNOSIS — E8779 Other fluid overload: Secondary | ICD-10-CM

## 2012-05-21 DIAGNOSIS — I472 Ventricular tachycardia: Secondary | ICD-10-CM

## 2012-05-21 LAB — BASIC METABOLIC PANEL
Anion Gap: 8 (ref 3–11)
Calcium: 8.9 mg/dL (ref 8.9–10.2)
Carbon Dioxide, Total: 29 mEq/L (ref 22–32)
Chloride: 102 mEq/L (ref 98–108)
Creatinine: 0.9 mg/dL (ref 0.51–1.18)
GFR, Calc, African American: >60 mL/min (ref >59)
GFR, Calc, European American: >60 mL/min (ref >59)
Glucose: 98 mg/dL (ref 62–125)
Potassium: 4 mEq/L (ref 3.7–5.2)
Sodium: 139 mEq/L (ref 136–145)
Urea Nitrogen: 7 mg/dL — ABNORMAL LOW (ref 8–21)

## 2012-05-21 LAB — CBC (HEMOGRAM)
Hematocrit: 35 % — ABNORMAL LOW (ref 38–50)
Hemoglobin: 11.6 g/dL — ABNORMAL LOW (ref 13.0–18.0)
MCH: 31.9 pg (ref 27.3–33.6)
MCHC: 33 g/dL (ref 32.2–36.5)
MCV: 97 fL (ref 81–98)
Platelet Count: 234 10*3/uL (ref 150–400)
RBC: 3.64 mil/uL — ABNORMAL LOW (ref 4.40–5.60)
RDW-CV: 14 % (ref 11.6–14.4)
WBC: 5.59 10*3/uL (ref 4.30–10.00)

## 2012-05-25 ENCOUNTER — Inpatient Hospital Stay (HOSPITAL_COMMUNITY): Payer: Self-pay | Admitting: Cardiology

## 2012-05-25 ENCOUNTER — Inpatient Hospital Stay (HOSPITAL_BASED_OUTPATIENT_CLINIC_OR_DEPARTMENT_OTHER)
Admission: EM | Admit: 2012-05-25 | Discharge: 2012-05-30 | DRG: 544 | Disposition: A | Discharge status: Home / Self Care | Payer: Self-pay | Attending: Cardiovascular Disease | Admitting: Cardiovascular Disease

## 2012-05-25 ENCOUNTER — Other Ambulatory Visit (EMERGENCY_DEPARTMENT_HOSPITAL): Payer: Self-pay | Admitting: Physician Assistant

## 2012-05-25 ENCOUNTER — Other Ambulatory Visit (EMERGENCY_DEPARTMENT_HOSPITAL): Payer: Self-pay | Admitting: Internal Medicine

## 2012-05-25 DIAGNOSIS — F1010 Alcohol abuse, uncomplicated: Secondary | ICD-10-CM | POA: Diagnosis present

## 2012-05-25 DIAGNOSIS — Z7901 Long term (current) use of anticoagulants: Secondary | ICD-10-CM

## 2012-05-25 DIAGNOSIS — J90 Pleural effusion, not elsewhere classified: Secondary | ICD-10-CM | POA: Diagnosis present

## 2012-05-25 DIAGNOSIS — I5023 Acute on chronic systolic (congestive) heart failure: Secondary | ICD-10-CM | POA: Diagnosis present

## 2012-05-25 DIAGNOSIS — I080 Rheumatic disorders of both mitral and aortic valves: Secondary | ICD-10-CM | POA: Diagnosis present

## 2012-05-25 DIAGNOSIS — Z7982 Long term (current) use of aspirin: Secondary | ICD-10-CM

## 2012-05-25 DIAGNOSIS — R079 Chest pain, unspecified: Secondary | ICD-10-CM

## 2012-05-25 DIAGNOSIS — D649 Anemia, unspecified: Secondary | ICD-10-CM | POA: Diagnosis present

## 2012-05-25 DIAGNOSIS — I509 Heart failure, unspecified: Secondary | ICD-10-CM | POA: Diagnosis present

## 2012-05-25 DIAGNOSIS — I4891 Unspecified atrial fibrillation: Secondary | ICD-10-CM

## 2012-05-25 DIAGNOSIS — Z79899 Other long term (current) drug therapy: Secondary | ICD-10-CM

## 2012-05-25 DIAGNOSIS — Z87898 Personal history of other specified conditions: Secondary | ICD-10-CM

## 2012-05-25 LAB — PROTHROMBIN & PTT
Partial Thromboplastin Time: 31 s (ref 22–35)
Prothrombin INR: 1.3 (ref 0.8–1.3)
Prothrombin Time Patient: 16.1 s — ABNORMAL HIGH (ref 10.7–15.6)

## 2012-05-25 LAB — BASIC METABOLIC PANEL
Anion Gap: 8 (ref 3–11)
Calcium: 9.3 mg/dL (ref 8.9–10.2)
Carbon Dioxide, Total: 27 mEq/L (ref 22–32)
Chloride: 103 mEq/L (ref 98–108)
Creatinine: 0.96 mg/dL (ref 0.51–1.18)
GFR, Calc, African American: >60 mL/min (ref >59)
GFR, Calc, European American: >60 mL/min (ref >59)
Glucose: 168 mg/dL — ABNORMAL HIGH (ref 62–125)
Potassium: 4.1 mEq/L (ref 3.7–5.2)
Sodium: 138 mEq/L (ref 136–145)
Urea Nitrogen: 12 mg/dL (ref 8–21)

## 2012-05-25 LAB — LAB ADD ON ORDER

## 2012-05-25 LAB — CHEST PAIN REFLEXIVE TESTING
Troponin_I Interpretation: ELEVATED
Troponin_I: 0.04 ng/mL — ABNORMAL HIGH (ref <0.04)

## 2012-05-25 LAB — ALCOHOL (ETHYL): Alcohol (Ethyl): NEGATIVE mg/dL

## 2012-05-25 LAB — 1ST EXTRA LAVENDER TOP

## 2012-05-26 ENCOUNTER — Other Ambulatory Visit (EMERGENCY_DEPARTMENT_HOSPITAL): Payer: Self-pay | Admitting: Internal Medicine

## 2012-05-26 ENCOUNTER — Other Ambulatory Visit (HOSPITAL_COMMUNITY): Payer: Self-pay | Admitting: Internal Medicine

## 2012-05-26 DIAGNOSIS — J90 Pleural effusion, not elsewhere classified: Secondary | ICD-10-CM

## 2012-05-26 DIAGNOSIS — E8779 Other fluid overload: Secondary | ICD-10-CM

## 2012-05-26 DIAGNOSIS — R0602 Shortness of breath: Secondary | ICD-10-CM

## 2012-05-26 DIAGNOSIS — R002 Palpitations: Secondary | ICD-10-CM

## 2012-05-26 DIAGNOSIS — J9819 Other pulmonary collapse: Secondary | ICD-10-CM

## 2012-05-26 DIAGNOSIS — I4891 Unspecified atrial fibrillation: Secondary | ICD-10-CM

## 2012-05-26 LAB — BLOOD GAS, ARTERIAL (NO ELECTROLYTES)
Base Deficit Blood, ART: 0.9 mEq/L (ref 0.0–2.0)
Base Deficit Blood, ART: NEGATIVE
Bicarbonate: 23 mEq/L (ref 22–26)
pCO2, ART: 37 mm Hg (ref 33–48)
pH, ART: 7.41 (ref 7.35–7.45)
pO2: 69 mm Hg (ref 63–87)

## 2012-05-26 LAB — BASIC METABOLIC PANEL
Anion Gap: 8 (ref 3–11)
Calcium: 8.6 mg/dL — ABNORMAL LOW (ref 8.9–10.2)
Carbon Dioxide, Total: 24 mEq/L (ref 22–32)
Chloride: 106 mEq/L (ref 98–108)
Creatinine: 1.21 mg/dL — ABNORMAL HIGH (ref 0.51–1.18)
GFR, Calc, African American: >60 mL/min (ref >59)
GFR, Calc, European American: >60 mL/min (ref >59)
Glucose: 154 mg/dL — ABNORMAL HIGH (ref 62–125)
Potassium: 4.1 mEq/L (ref 3.7–5.2)
Sodium: 138 mEq/L (ref 136–145)
Urea Nitrogen: 12 mg/dL (ref 8–21)

## 2012-05-26 LAB — D-DIMER,QUANT: D_Dimer, Quant: 0.93 mcg/mL FEU — ABNORMAL HIGH (ref 0.00–0.59)

## 2012-05-26 LAB — LAB ADD ON ORDER

## 2012-05-26 LAB — CBC (HEMOGRAM)
Hematocrit: 35 % — ABNORMAL LOW (ref 38–50)
Hemoglobin: 11.3 g/dL — ABNORMAL LOW (ref 13.0–18.0)
MCH: 31.7 pg (ref 27.3–33.6)
MCHC: 32.6 g/dL (ref 32.2–36.5)
MCV: 97 fL (ref 81–98)
Platelet Count: 208 10*3/uL (ref 150–400)
RBC: 3.57 mil/uL — ABNORMAL LOW (ref 4.40–5.60)
RDW-CV: 14.1 % (ref 11.6–14.4)
WBC: 8.08 10*3/uL (ref 4.30–10.00)

## 2012-05-26 LAB — PROTHROMBIN TIME
Prothrombin INR: 1.4 — ABNORMAL HIGH (ref 0.8–1.3)
Prothrombin INR: 1.4 — ABNORMAL HIGH (ref 0.8–1.3)
Prothrombin Time Patient: 17.2 s — ABNORMAL HIGH (ref 10.7–15.6)
Prothrombin Time Patient: 17.4 s — ABNORMAL HIGH (ref 10.7–15.6)

## 2012-05-26 LAB — CHEST PAIN REFLEXIVE TESTING
Troponin_I Interpretation: ELEVATED
Troponin_I Interpretation: ELEVATED
Troponin_I: 0.04 ng/mL — ABNORMAL HIGH (ref <0.04)
Troponin_I: 0.05 ng/mL — ABNORMAL HIGH (ref <0.04)

## 2012-05-26 LAB — MAGNESIUM
Magnesium: 1.9 mg/dL (ref 1.8–2.4)
Magnesium: 1.9 mg/dL (ref 1.8–2.4)

## 2012-05-26 LAB — PHOSPHATE
Phosphate: 4.4 mg/dL (ref 2.5–4.5)
Phosphate: 4.6 mg/dL — ABNORMAL HIGH (ref 2.5–4.5)

## 2012-05-26 LAB — B_TYPE NATRIURETIC PEPTIDE: B_Type Natriuretic Peptide: 446 pg/mL — ABNORMAL HIGH (ref <101)

## 2012-05-27 ENCOUNTER — Other Ambulatory Visit (HOSPITAL_COMMUNITY): Payer: Self-pay | Admitting: Internal Medicine

## 2012-05-27 ENCOUNTER — Encounter (HOSPITAL_BASED_OUTPATIENT_CLINIC_OR_DEPARTMENT_OTHER): Payer: Self-pay

## 2012-05-27 DIAGNOSIS — D649 Anemia, unspecified: Secondary | ICD-10-CM

## 2012-05-27 DIAGNOSIS — I519 Heart disease, unspecified: Secondary | ICD-10-CM

## 2012-05-27 DIAGNOSIS — I509 Heart failure, unspecified: Secondary | ICD-10-CM

## 2012-05-27 LAB — PHOSPHATE: Phosphate: 4.8 mg/dL — ABNORMAL HIGH (ref 2.5–4.5)

## 2012-05-27 LAB — R/O MRSA

## 2012-05-27 LAB — CBC (HEMOGRAM)
Hematocrit: 37 % — ABNORMAL LOW (ref 38–50)
Hemoglobin: 11.9 g/dL — ABNORMAL LOW (ref 13.0–18.0)
MCH: 31.5 pg (ref 27.3–33.6)
MCHC: 32.5 g/dL (ref 32.2–36.5)
MCV: 97 fL (ref 81–98)
Platelet Count: 197 10*3/uL (ref 150–400)
RBC: 3.78 mil/uL — ABNORMAL LOW (ref 4.40–5.60)
RDW-CV: 14.1 % (ref 11.6–14.4)
WBC: 5.75 10*3/uL (ref 4.30–10.00)

## 2012-05-27 LAB — BASIC METABOLIC PANEL
Anion Gap: 5 (ref 3–11)
Calcium: 9.4 mg/dL (ref 8.9–10.2)
Carbon Dioxide, Total: 29 mEq/L (ref 22–32)
Chloride: 104 mEq/L (ref 98–108)
Creatinine: 0.95 mg/dL (ref 0.51–1.18)
GFR, Calc, African American: >60 mL/min (ref >59)
GFR, Calc, European American: >60 mL/min (ref >59)
Glucose: 98 mg/dL (ref 62–125)
Potassium: 4.6 mEq/L (ref 3.7–5.2)
Sodium: 138 mEq/L (ref 136–145)
Urea Nitrogen: 12 mg/dL (ref 8–21)

## 2012-05-27 LAB — MAGNESIUM: Magnesium: 1.8 mg/dL (ref 1.8–2.4)

## 2012-05-27 LAB — PROTHROMBIN TIME
Prothrombin INR: 2 — ABNORMAL HIGH (ref 0.8–1.3)
Prothrombin Time Patient: 22.3 s — ABNORMAL HIGH (ref 10.7–15.6)

## 2012-05-28 ENCOUNTER — Other Ambulatory Visit (HOSPITAL_COMMUNITY): Payer: Self-pay | Admitting: Internal Medicine

## 2012-05-28 DIAGNOSIS — I501 Left ventricular failure: Secondary | ICD-10-CM

## 2012-05-28 DIAGNOSIS — I5020 Unspecified systolic (congestive) heart failure: Secondary | ICD-10-CM

## 2012-05-28 DIAGNOSIS — I4891 Unspecified atrial fibrillation: Secondary | ICD-10-CM

## 2012-05-28 DIAGNOSIS — I509 Heart failure, unspecified: Secondary | ICD-10-CM

## 2012-05-28 LAB — FOLATE & VITAMIN B12
Folate, SRM: 14.3 ng/mL (ref >5.8)
Vitamin B12 (Cobalamin): 541 pg/mL (ref 180–914)
Vitamin B12 (Cobalamin): NORMAL

## 2012-05-28 LAB — R/O ACINETOBACTER CULTURE

## 2012-05-28 LAB — BASIC METABOLIC PANEL
Anion Gap: 8 (ref 3–11)
Calcium: 9.4 mg/dL (ref 8.9–10.2)
Carbon Dioxide, Total: 30 mEq/L (ref 22–32)
Chloride: 100 mEq/L (ref 98–108)
Creatinine: 0.85 mg/dL (ref 0.51–1.18)
GFR, Calc, African American: >60 mL/min (ref >59)
GFR, Calc, European American: >60 mL/min (ref >59)
Glucose: 85 mg/dL (ref 62–125)
Potassium: 4.4 mEq/L (ref 3.7–5.2)
Sodium: 138 mEq/L (ref 136–145)
Urea Nitrogen: 11 mg/dL (ref 8–21)

## 2012-05-28 LAB — CBC (HEMOGRAM)
Hematocrit: 37 % — ABNORMAL LOW (ref 38–50)
Hemoglobin: 11.9 g/dL — ABNORMAL LOW (ref 13.0–18.0)
MCH: 31.4 pg (ref 27.3–33.6)
MCHC: 32.2 g/dL (ref 32.2–36.5)
MCV: 98 fL (ref 81–98)
Platelet Count: 201 10*3/uL (ref 150–400)
RBC: 3.79 mil/uL — ABNORMAL LOW (ref 4.40–5.60)
RDW-CV: 14.1 % (ref 11.6–14.4)
WBC: 5.17 10*3/uL (ref 4.30–10.00)

## 2012-05-28 LAB — PROTHROMBIN TIME
Prothrombin INR: 2 — ABNORMAL HIGH (ref 0.8–1.3)
Prothrombin Time Patient: 22.1 s — ABNORMAL HIGH (ref 10.7–15.6)

## 2012-05-28 LAB — VANCO-RESIST ENTEROCOCCUS C/S

## 2012-05-29 ENCOUNTER — Other Ambulatory Visit (HOSPITAL_COMMUNITY): Payer: Self-pay | Admitting: Internal Medicine

## 2012-05-29 DIAGNOSIS — I455 Other specified heart block: Secondary | ICD-10-CM

## 2012-05-29 LAB — CBC (HEMOGRAM)
Hematocrit: 37 % — ABNORMAL LOW (ref 38–50)
Hemoglobin: 12.1 g/dL — ABNORMAL LOW (ref 13.0–18.0)
MCH: 32 pg (ref 27.3–33.6)
MCHC: 33.2 g/dL (ref 32.2–36.5)
MCV: 97 fL (ref 81–98)
Platelet Count: 193 10*3/uL (ref 150–400)
RBC: 3.78 mil/uL — ABNORMAL LOW (ref 4.40–5.60)
RDW-CV: 14 % (ref 11.6–14.4)
WBC: 4.92 10*3/uL (ref 4.30–10.00)

## 2012-05-29 LAB — BASIC METABOLIC PANEL
Anion Gap: 6 (ref 3–11)
Calcium: 9 mg/dL (ref 8.9–10.2)
Carbon Dioxide, Total: 27 mEq/L (ref 22–32)
Chloride: 104 mEq/L (ref 98–108)
Creatinine: 0.75 mg/dL (ref 0.51–1.18)
GFR, Calc, African American: >60 mL/min (ref >59)
GFR, Calc, European American: >60 mL/min (ref >59)
Glucose: 90 mg/dL (ref 62–125)
Potassium: 4.3 mEq/L (ref 3.7–5.2)
Sodium: 137 mEq/L (ref 136–145)
Urea Nitrogen: 13 mg/dL (ref 8–21)

## 2012-05-29 LAB — PROTHROMBIN TIME
Prothrombin INR: 2.4 — ABNORMAL HIGH (ref 0.8–1.3)
Prothrombin Time Patient: 25.9 s — ABNORMAL HIGH (ref 10.7–15.6)

## 2012-05-30 ENCOUNTER — Other Ambulatory Visit (HOSPITAL_COMMUNITY): Payer: Self-pay | Admitting: Internal Medicine

## 2012-05-30 LAB — BASIC METABOLIC PANEL
Anion Gap: 7 (ref 3–11)
Calcium: 9 mg/dL (ref 8.9–10.2)
Carbon Dioxide, Total: 26 mEq/L (ref 22–32)
Chloride: 104 mEq/L (ref 98–108)
Creatinine: 0.89 mg/dL (ref 0.51–1.18)
GFR, Calc, African American: >60 mL/min (ref >59)
GFR, Calc, European American: >60 mL/min (ref >59)
Glucose: 160 mg/dL — ABNORMAL HIGH (ref 62–125)
Potassium: 4 mEq/L (ref 3.7–5.2)
Sodium: 137 mEq/L (ref 136–145)
Urea Nitrogen: 14 mg/dL (ref 8–21)

## 2012-05-30 LAB — PROTHROMBIN TIME
Prothrombin INR: 2.4 — ABNORMAL HIGH (ref 0.8–1.3)
Prothrombin Time Patient: 25.5 s — ABNORMAL HIGH (ref 10.7–15.6)

## 2012-05-30 LAB — T4, FREE: Thyroxine (Free): 0.9 ng/dL (ref 0.6–1.2)

## 2012-05-30 LAB — THYROID STIMULATING HORMONE: Thyroid Stimulating Hormone: 5.05 u[IU]/mL — ABNORMAL HIGH (ref 0.400–5.000)

## 2012-06-01 ENCOUNTER — Encounter (HOSPITAL_BASED_OUTPATIENT_CLINIC_OR_DEPARTMENT_OTHER): Payer: Self-pay | Admitting: Cardiovascular Disease

## 2012-06-06 ENCOUNTER — Other Ambulatory Visit (EMERGENCY_DEPARTMENT_HOSPITAL): Payer: Self-pay | Admitting: Physician Assistant

## 2012-06-06 ENCOUNTER — Inpatient Hospital Stay (HOSPITAL_COMMUNITY): Payer: Self-pay | Admitting: Cardiovascular Disease

## 2012-06-06 ENCOUNTER — Other Ambulatory Visit (HOSPITAL_COMMUNITY): Payer: Self-pay | Admitting: Internal Medicine

## 2012-06-06 ENCOUNTER — Other Ambulatory Visit (HOSPITAL_COMMUNITY): Payer: Self-pay | Admitting: Hematology & Oncology

## 2012-06-06 ENCOUNTER — Other Ambulatory Visit (EMERGENCY_DEPARTMENT_HOSPITAL): Payer: Self-pay | Admitting: Internal Medicine

## 2012-06-06 ENCOUNTER — Inpatient Hospital Stay (HOSPITAL_BASED_OUTPATIENT_CLINIC_OR_DEPARTMENT_OTHER)
Admission: EM | Admit: 2012-06-06 | Discharge: 2012-06-08 | DRG: 544 | Disposition: A | Discharge status: Home / Self Care | Payer: Self-pay | Attending: Cardiovascular Disease | Admitting: Cardiovascular Disease

## 2012-06-06 DIAGNOSIS — I5189 Other ill-defined heart diseases: Secondary | ICD-10-CM | POA: Diagnosis present

## 2012-06-06 DIAGNOSIS — I4892 Unspecified atrial flutter: Secondary | ICD-10-CM | POA: Diagnosis present

## 2012-06-06 DIAGNOSIS — I4891 Unspecified atrial fibrillation: Principal | ICD-10-CM | POA: Diagnosis present

## 2012-06-06 DIAGNOSIS — Z87891 Personal history of nicotine dependence: Secondary | ICD-10-CM

## 2012-06-06 DIAGNOSIS — I5023 Acute on chronic systolic (congestive) heart failure: Secondary | ICD-10-CM | POA: Diagnosis present

## 2012-06-06 DIAGNOSIS — Z87898 Personal history of other specified conditions: Secondary | ICD-10-CM

## 2012-06-06 DIAGNOSIS — I5022 Chronic systolic (congestive) heart failure: Secondary | ICD-10-CM

## 2012-06-06 DIAGNOSIS — R000 Tachycardia, unspecified: Secondary | ICD-10-CM

## 2012-06-06 DIAGNOSIS — F411 Generalized anxiety disorder: Secondary | ICD-10-CM | POA: Diagnosis present

## 2012-06-06 DIAGNOSIS — I428 Other cardiomyopathies: Secondary | ICD-10-CM | POA: Diagnosis present

## 2012-06-06 DIAGNOSIS — R0609 Other forms of dyspnea: Secondary | ICD-10-CM

## 2012-06-06 LAB — BASIC METABOLIC PANEL
Anion Gap: 7 (ref 3–11)
Anion Gap: 8 (ref 3–11)
Calcium: 9.1 mg/dL (ref 8.9–10.2)
Calcium: 8.9 mg/dL (ref 8.9–10.2)
Carbon Dioxide, Total: 28 mEq/L (ref 22–32)
Carbon Dioxide, Total: 29 mEq/L (ref 22–32)
Chloride: 105 mEq/L (ref 98–108)
Chloride: 101 mEq/L (ref 98–108)
Creatinine: 1.06 mg/dL (ref 0.51–1.18)
Creatinine: 1 mg/dL (ref 0.51–1.18)
GFR, Calc, African American: >60 mL/min (ref >59)
GFR, Calc, African American: >60 mL/min (ref >59)
GFR, Calc, European American: >60 mL/min (ref >59)
GFR, Calc, European American: >60 mL/min (ref >59)
Glucose: 123 mg/dL (ref 62–125)
Glucose: 122 mg/dL (ref 62–125)
Potassium: 4.5 mEq/L (ref 3.7–5.2)
Potassium: 4.1 mEq/L (ref 3.7–5.2)
Sodium: 140 mEq/L (ref 136–145)
Sodium: 138 mEq/L (ref 136–145)
Urea Nitrogen: 17 mg/dL (ref 8–21)
Urea Nitrogen: 17 mg/dL (ref 8–21)

## 2012-06-06 LAB — CBC, DIFF
% Basophils: 1 %
% Eosinophils: 3 %
% Immature Granulocytes: 0 %
% Lymphocytes: 28 %
% Monocytes: 10 %
% Neutrophils: 58 %
Absolute Eosinophil Count: 0.17 10*3/uL (ref 0.00–0.50)
Absolute Lymphocyte Count: 1.85 10*3/uL (ref 1.00–4.80)
Basophils: 0.04 10*3/uL (ref 0.00–0.20)
Hematocrit: 39 % (ref 38–50)
Hemoglobin: 12.4 g/dL — ABNORMAL LOW (ref 13.0–18.0)
Immature Granulocytes: 0.02 10*3/uL (ref 0.00–0.05)
MCH: 31.3 pg (ref 27.3–33.6)
MCHC: 31.9 g/dL — ABNORMAL LOW (ref 32.2–36.5)
MCV: 98 fL (ref 81–98)
Monocytes: 0.66 10*3/uL (ref 0.00–0.80)
Neutrophils: 3.78 10*3/uL (ref 1.80–7.00)
Platelet Count: 216 10*3/uL (ref 150–400)
RBC: 3.96 mil/uL — ABNORMAL LOW (ref 4.40–5.60)
RDW-CV: 14 % (ref 11.6–14.4)
WBC: 6.52 10*3/uL (ref 4.30–10.00)

## 2012-06-06 LAB — PROTHROMBIN & PTT
Partial Thromboplastin Time: 45 s — ABNORMAL HIGH (ref 22–35)
Prothrombin INR: 3.9 — ABNORMAL HIGH (ref 0.8–1.3)
Prothrombin Time Patient: 37.4 s — ABNORMAL HIGH (ref 10.7–15.6)

## 2012-06-06 LAB — HEPATIC FUNCTION PANEL
ALT (GPT): 57 U/L — ABNORMAL HIGH (ref 10–48)
AST (GOT): 30 U/L (ref 15–40)
Albumin: 3.4 g/dL — ABNORMAL LOW (ref 3.5–5.2)
Alkaline Phosphatase (Total): 81 U/L (ref 37–159)
Bilirubin (Direct): 0.3 mg/dL (ref 0.0–0.3)
Bilirubin (Total): 1.4 mg/dL — ABNORMAL HIGH (ref 0.2–1.3)
Protein (Total): 6.7 g/dL (ref 6.0–8.2)

## 2012-06-06 LAB — DIGOXIN: Digoxin: 0.7 ng/mL (ref 0.5–1.9)

## 2012-06-06 LAB — LAB ADD ON ORDER

## 2012-06-06 LAB — CHEST PAIN REFLEXIVE TESTING
Troponin_I Interpretation: NORMAL
Troponin_I: 0.03 ng/mL (ref <0.04)

## 2012-06-06 LAB — B_TYPE NATRIURETIC PEPTIDE: B_Type Natriuretic Peptide: 2123 pg/mL — ABNORMAL HIGH (ref <101)

## 2012-06-06 LAB — ALCOHOL (ETHYL): Alcohol (Ethyl): NEGATIVE mg/dL

## 2012-06-07 ENCOUNTER — Other Ambulatory Visit (HOSPITAL_COMMUNITY): Payer: Self-pay | Admitting: Hematology & Oncology

## 2012-06-07 DIAGNOSIS — I5020 Unspecified systolic (congestive) heart failure: Secondary | ICD-10-CM

## 2012-06-07 DIAGNOSIS — I4891 Unspecified atrial fibrillation: Secondary | ICD-10-CM

## 2012-06-07 LAB — CBC (HEMOGRAM)
Hematocrit: 38 % (ref 38–50)
Hemoglobin: 12.3 g/dL — ABNORMAL LOW (ref 13.0–18.0)
MCH: 31.5 pg (ref 27.3–33.6)
MCHC: 32.6 g/dL (ref 32.2–36.5)
MCV: 96 fL (ref 81–98)
Platelet Count: 192 10*3/uL (ref 150–400)
RBC: 3.91 mil/uL — ABNORMAL LOW (ref 4.40–5.60)
RDW-CV: 14 % (ref 11.6–14.4)
WBC: 5.91 10*3/uL (ref 4.30–10.00)

## 2012-06-07 LAB — BASIC METABOLIC PANEL
Anion Gap: 8 (ref 3–11)
Calcium: 8.8 mg/dL — ABNORMAL LOW (ref 8.9–10.2)
Carbon Dioxide, Total: 29 mEq/L (ref 22–32)
Chloride: 101 mEq/L (ref 98–108)
Creatinine: 0.93 mg/dL (ref 0.51–1.18)
GFR, Calc, African American: >60 mL/min (ref >59)
GFR, Calc, European American: >60 mL/min (ref >59)
Glucose: 108 mg/dL (ref 62–125)
Potassium: 3.9 mEq/L (ref 3.7–5.2)
Sodium: 138 mEq/L (ref 136–145)
Urea Nitrogen: 14 mg/dL (ref 8–21)

## 2012-06-07 LAB — PROTHROMBIN TIME
Prothrombin INR: 3.7 — ABNORMAL HIGH (ref 0.8–1.3)
Prothrombin Time Patient: 36.3 s — ABNORMAL HIGH (ref 10.7–15.6)

## 2012-06-08 ENCOUNTER — Other Ambulatory Visit (HOSPITAL_COMMUNITY): Payer: Self-pay | Admitting: Internal Medicine

## 2012-06-08 LAB — CBC (HEMOGRAM)
Hematocrit: 39 % (ref 38–50)
Hemoglobin: 12.9 g/dL — ABNORMAL LOW (ref 13.0–18.0)
MCH: 31.5 pg (ref 27.3–33.6)
MCHC: 33 g/dL (ref 32.2–36.5)
MCV: 96 fL (ref 81–98)
Platelet Count: 198 10*3/uL (ref 150–400)
RBC: 4.09 mil/uL — ABNORMAL LOW (ref 4.40–5.60)
RDW-CV: 13.9 % (ref 11.6–14.4)
WBC: 5.67 10*3/uL (ref 4.30–10.00)

## 2012-06-08 LAB — BASIC METABOLIC PANEL
Anion Gap: 10 (ref 3–11)
Calcium: 9.1 mg/dL (ref 8.9–10.2)
Carbon Dioxide, Total: 29 mEq/L (ref 22–32)
Chloride: 100 mEq/L (ref 98–108)
Creatinine: 0.95 mg/dL (ref 0.51–1.18)
GFR, Calc, African American: >60 mL/min (ref >59)
GFR, Calc, European American: >60 mL/min (ref >59)
Glucose: 98 mg/dL (ref 62–125)
Potassium: 3.9 mEq/L (ref 3.7–5.2)
Sodium: 139 mEq/L (ref 136–145)
Urea Nitrogen: 16 mg/dL (ref 8–21)

## 2012-06-08 LAB — PROTHROMBIN TIME
Prothrombin INR: 2.5 — ABNORMAL HIGH (ref 0.8–1.3)
Prothrombin Time Patient: 26.9 s — ABNORMAL HIGH (ref 10.7–15.6)

## 2012-06-09 ENCOUNTER — Other Ambulatory Visit (HOSPITAL_BASED_OUTPATIENT_CLINIC_OR_DEPARTMENT_OTHER): Payer: Self-pay

## 2012-06-09 DIAGNOSIS — Z5181 Encounter for therapeutic drug level monitoring: Secondary | ICD-10-CM

## 2012-06-09 DIAGNOSIS — Z7901 Long term (current) use of anticoagulants: Secondary | ICD-10-CM

## 2012-06-09 DIAGNOSIS — I4891 Unspecified atrial fibrillation: Secondary | ICD-10-CM

## 2012-06-11 ENCOUNTER — Other Ambulatory Visit (HOSPITAL_COMMUNITY): Payer: Self-pay

## 2012-06-11 ENCOUNTER — Ambulatory Visit (HOSPITAL_BASED_OUTPATIENT_CLINIC_OR_DEPARTMENT_OTHER): Payer: Self-pay | Attending: Internal Medicine

## 2012-06-11 ENCOUNTER — Ambulatory Visit (HOSPITAL_BASED_OUTPATIENT_CLINIC_OR_DEPARTMENT_OTHER): Payer: Self-pay

## 2012-06-11 ENCOUNTER — Other Ambulatory Visit (HOSPITAL_BASED_OUTPATIENT_CLINIC_OR_DEPARTMENT_OTHER): Payer: Self-pay

## 2012-06-11 LAB — DIGOXIN: Digoxin: 1.1 ng/mL (ref 0.5–1.9)

## 2012-06-14 NOTE — Progress Notes (Signed)
Patient's anticoagulation currently managed by outside PCP/clinic, confirmed today w/[facility]:      Dr. Luisa Hart L. Gemperline  [facility]   [address]  [place]  [address]  [phone]    We are closing referral at this time.

## 2012-06-17 ENCOUNTER — Other Ambulatory Visit (HOSPITAL_BASED_OUTPATIENT_CLINIC_OR_DEPARTMENT_OTHER): Payer: Self-pay | Admitting: Internal Medicine

## 2012-06-17 ENCOUNTER — Ambulatory Visit (HOSPITAL_BASED_OUTPATIENT_CLINIC_OR_DEPARTMENT_OTHER): Payer: Self-pay | Attending: Internal Medicine | Admitting: Internal Medicine

## 2012-06-17 DIAGNOSIS — I428 Other cardiomyopathies: Secondary | ICD-10-CM

## 2012-06-17 DIAGNOSIS — I4891 Unspecified atrial fibrillation: Secondary | ICD-10-CM

## 2012-06-17 LAB — BASIC METABOLIC PANEL
Anion Gap: 9 (ref 3–11)
Calcium: 9.5 mg/dL (ref 8.9–10.2)
Carbon Dioxide, Total: 30 mEq/L (ref 22–32)
Chloride: 100 mEq/L (ref 98–108)
Creatinine: 1.49 mg/dL — ABNORMAL HIGH (ref 0.51–1.18)
GFR, Calc, African American: 58 mL/min — ABNORMAL LOW (ref >59)
GFR, Calc, European American: 48 mL/min — ABNORMAL LOW (ref >59)
Glucose: 106 mg/dL (ref 62–125)
Potassium: 5.1 mEq/L (ref 3.7–5.2)
Sodium: 139 mEq/L (ref 136–145)
Urea Nitrogen: 30 mg/dL — ABNORMAL HIGH (ref 8–21)

## 2012-06-17 LAB — DIGOXIN: Digoxin: 1.2 ng/mL (ref 0.5–1.9)

## 2012-06-29 ENCOUNTER — Ambulatory Visit (HOSPITAL_BASED_OUTPATIENT_CLINIC_OR_DEPARTMENT_OTHER): Payer: No Typology Code available for payment source | Attending: Cardiovascular Disease

## 2012-06-29 ENCOUNTER — Other Ambulatory Visit (HOSPITAL_BASED_OUTPATIENT_CLINIC_OR_DEPARTMENT_OTHER): Payer: Self-pay

## 2012-06-29 ENCOUNTER — Ambulatory Visit (HOSPITAL_BASED_OUTPATIENT_CLINIC_OR_DEPARTMENT_OTHER): Payer: No Typology Code available for payment source

## 2012-06-29 DIAGNOSIS — Z5181 Encounter for therapeutic drug level monitoring: Secondary | ICD-10-CM | POA: Insufficient documentation

## 2012-06-29 DIAGNOSIS — Z79899 Other long term (current) drug therapy: Secondary | ICD-10-CM | POA: Insufficient documentation

## 2012-06-29 DIAGNOSIS — I4892 Unspecified atrial flutter: Secondary | ICD-10-CM

## 2012-06-29 DIAGNOSIS — Z0189 Encounter for other specified special examinations: Secondary | ICD-10-CM | POA: Insufficient documentation

## 2012-06-29 DIAGNOSIS — I4891 Unspecified atrial fibrillation: Secondary | ICD-10-CM

## 2012-06-29 LAB — BASIC METABOLIC PANEL
Anion Gap: 10 (ref 3–11)
Calcium: 9 mg/dL (ref 8.9–10.2)
Carbon Dioxide, Total: 29 mEq/L (ref 22–32)
Chloride: 100 mEq/L (ref 98–108)
Creatinine: 1.27 mg/dL — ABNORMAL HIGH (ref 0.51–1.18)
GFR, Calc, African American: >60 mL/min (ref >59)
GFR, Calc, European American: 57 mL/min — ABNORMAL LOW (ref >59)
Glucose: 105 mg/dL (ref 62–125)
Potassium: 4.3 mEq/L (ref 3.7–5.2)
Sodium: 139 mEq/L (ref 136–145)
Urea Nitrogen: 23 mg/dL — ABNORMAL HIGH (ref 8–21)

## 2012-06-29 LAB — DIGOXIN: Digoxin: 0.5 ng/mL (ref 0.5–1.9)

## 2012-07-06 ENCOUNTER — Encounter (HOSPITAL_BASED_OUTPATIENT_CLINIC_OR_DEPARTMENT_OTHER): Payer: Self-pay | Admitting: Cardiovascular Disease

## 2012-07-08 ENCOUNTER — Ambulatory Visit (HOSPITAL_BASED_OUTPATIENT_CLINIC_OR_DEPARTMENT_OTHER): Payer: No Typology Code available for payment source | Attending: Internal Medicine

## 2012-07-08 DIAGNOSIS — I509 Heart failure, unspecified: Secondary | ICD-10-CM | POA: Insufficient documentation

## 2012-07-08 DIAGNOSIS — I4891 Unspecified atrial fibrillation: Secondary | ICD-10-CM | POA: Insufficient documentation

## 2012-07-22 ENCOUNTER — Ambulatory Visit (HOSPITAL_BASED_OUTPATIENT_CLINIC_OR_DEPARTMENT_OTHER): Payer: No Typology Code available for payment source | Attending: Internal Medicine

## 2012-07-22 ENCOUNTER — Other Ambulatory Visit (HOSPITAL_BASED_OUTPATIENT_CLINIC_OR_DEPARTMENT_OTHER): Payer: Self-pay

## 2012-07-22 ENCOUNTER — Encounter (HOSPITAL_BASED_OUTPATIENT_CLINIC_OR_DEPARTMENT_OTHER): Payer: Self-pay

## 2012-07-22 DIAGNOSIS — Z0189 Encounter for other specified special examinations: Secondary | ICD-10-CM | POA: Insufficient documentation

## 2012-07-22 DIAGNOSIS — Z79899 Other long term (current) drug therapy: Secondary | ICD-10-CM | POA: Insufficient documentation

## 2012-07-22 DIAGNOSIS — I509 Heart failure, unspecified: Secondary | ICD-10-CM | POA: Insufficient documentation

## 2012-07-22 LAB — BASIC METABOLIC PANEL
Anion Gap: 11 (ref 3–11)
Calcium: 9.5 mg/dL (ref 8.9–10.2)
Carbon Dioxide, Total: 29 mEq/L (ref 22–32)
Chloride: 99 mEq/L (ref 98–108)
Creatinine: 1.4 mg/dL — ABNORMAL HIGH (ref 0.51–1.18)
GFR, Calc, African American: >60 mL/min (ref >59)
GFR, Calc, European American: 51 mL/min — ABNORMAL LOW (ref >59)
Glucose: 101 mg/dL (ref 62–125)
Potassium: 4.1 mEq/L (ref 3.7–5.2)
Sodium: 139 mEq/L (ref 136–145)
Urea Nitrogen: 27 mg/dL — ABNORMAL HIGH (ref 8–21)

## 2012-07-22 LAB — DIGOXIN: Digoxin: 0.7 ng/mL (ref 0.5–1.9)

## 2012-07-29 ENCOUNTER — Encounter (HOSPITAL_BASED_OUTPATIENT_CLINIC_OR_DEPARTMENT_OTHER): Payer: Self-pay | Admitting: Acute Care

## 2012-08-12 ENCOUNTER — Encounter (HOSPITAL_BASED_OUTPATIENT_CLINIC_OR_DEPARTMENT_OTHER): Payer: Self-pay | Admitting: Internal Medicine

## 2012-08-18 ENCOUNTER — Telehealth (HOSPITAL_BASED_OUTPATIENT_CLINIC_OR_DEPARTMENT_OTHER): Payer: Self-pay | Admitting: Internal Medicine

## 2012-08-18 NOTE — Telephone Encounter (Signed)
CONFIRMED PHONE NUMBER: [phone]  CALLERS FIRST AND LAST NAME: Gena Fray  FACILITY NAME: na TITLE: na  CALLERS RELATIONSHIP:Self  RETURN CALL: General message OK     SUBJECT: Appointment Request   REASON FOR REQUEST: Follow-up    REQUEST APPOINTMENT WITH: Dr. Marye Round  REFERRING PROVIDER: na  REQUESTED DATE: ASAP  REQUESTED TIME: Any  UNABLE TO APPOINT: Schedule appears full

## 2012-08-19 ENCOUNTER — Encounter (HOSPITAL_BASED_OUTPATIENT_CLINIC_OR_DEPARTMENT_OTHER): Payer: No Typology Code available for payment source | Admitting: Internal Medicine

## 2012-08-19 DIAGNOSIS — I4891 Unspecified atrial fibrillation: Secondary | ICD-10-CM | POA: Insufficient documentation

## 2012-08-19 NOTE — Telephone Encounter (Signed)
Per Dr. Lucina Mellow we will schedule the patient for TEE DCCV to be done on 6/14.  Dr. Lucina Mellow will follow up with the patient in clinic following the procedure.

## 2012-08-23 ENCOUNTER — Other Ambulatory Visit (HOSPITAL_BASED_OUTPATIENT_CLINIC_OR_DEPARTMENT_OTHER): Payer: No Typology Code available for payment source | Admitting: Internal Medicine

## 2012-08-23 DIAGNOSIS — I5020 Unspecified systolic (congestive) heart failure: Secondary | ICD-10-CM

## 2012-08-23 DIAGNOSIS — I4891 Unspecified atrial fibrillation: Secondary | ICD-10-CM

## 2012-08-24 ENCOUNTER — Other Ambulatory Visit (HOSPITAL_BASED_OUTPATIENT_CLINIC_OR_DEPARTMENT_OTHER): Payer: Self-pay

## 2012-08-24 DIAGNOSIS — I4891 Unspecified atrial fibrillation: Secondary | ICD-10-CM

## 2012-08-24 DIAGNOSIS — Z7901 Long term (current) use of anticoagulants: Secondary | ICD-10-CM

## 2012-08-24 DIAGNOSIS — Z5181 Encounter for therapeutic drug level monitoring: Secondary | ICD-10-CM

## 2012-08-25 ENCOUNTER — Ambulatory Visit (HOSPITAL_BASED_OUTPATIENT_CLINIC_OR_DEPARTMENT_OTHER): Payer: No Typology Code available for payment source | Attending: Internal Medicine

## 2012-08-25 DIAGNOSIS — Z5181 Encounter for therapeutic drug level monitoring: Secondary | ICD-10-CM | POA: Insufficient documentation

## 2012-08-25 DIAGNOSIS — I4891 Unspecified atrial fibrillation: Secondary | ICD-10-CM | POA: Insufficient documentation

## 2012-08-25 DIAGNOSIS — Z7901 Long term (current) use of anticoagulants: Secondary | ICD-10-CM | POA: Insufficient documentation

## 2012-08-25 LAB — PROTHROMBIN TIME
Prothrombin INR: 1.3 (ref 0.8–1.3)
Prothrombin Time Patient: 15.8 s — ABNORMAL HIGH (ref 10.7–15.6)

## 2012-08-25 MED ORDER — WARFARIN SODIUM 5 MG OR TABS
ORAL_TABLET | ORAL | Status: DC
Start: 2012-08-25 — End: 2012-10-07

## 2012-08-25 NOTE — Progress Notes (Signed)
ID  Visit type: Appointed  Interpreter present: None  Referral type: Written by Dr. Marye Round on 08/19/2012 for anticoagulation.  PCP: Dr. Carlynn Herald, [facility], [phone]  Other Managing Provider: Dr. Marye Round, [facility]    Pt Contact Info: [phone]    Anticoagulation Information  Goal INR: 2--3  Indication: AFib  Duration: TBD--prior to cardioversion and at least 4 weekly INRs thereafter; duration to then be re-assessed by Cardiology     Warfarin  Warfarin tablet size: 5mg  and 1mg **GENERIC WARFARIN**filled at [facility] in [facility]  Weekly dose as of last visit: re-starting  Dose as of last visit: re-starting--pt states used to be on 5mg  MWF and 6mg  TTSS; he re-started on Monday, 08/23/12     Pt denies missed doses.  Pt denies extra doses.    SUBJECTIVE  Mark Poole is a 62 year old male here today to see pharmacist for an initial visit. Patient referred by [facility] for anticoagulation for upcoming cardioversion.  Patient states he has been on Warfarin before.  Per notes, it was discontinued due to nonadherence.  He states he was instructed to re-start by Cardiology.  He re-started on Monday, 08/23/12.  States he was previously on 5mg  MWF and 6mg  TTSS.  He is using the generic Warfarin 5mg  and 1mg  tablets.     LIFESTYLE  -Diet: Denies change in diet  -Substance Use: alcohol and tobacco use--need to ask next visit  -Exercise: No changes in activity level  -Other:     ALLERGIES  Review of patient's allergies indicates:  Allergies no known allergies    REVIEW OF SYSTEMS  CONSTITUTIONAL: Denies symptoms  -ANTICOAGULATION:        -BLEEDING SYMPTOMS: Denies symptoms       -THROMBOEMBOLISM SYMPTOMS            -STROKE: Denies symptoms          RESULTS REVIEW  INR      1.3   08/25/2012  INR      2.5   06/08/2012  INR      3.7   06/07/2012    ASSESSMENT/PLAN  INR subtherapeutic, patient just re-started 2 days ago.  Will plan for weekly INRs prior to cardioversion, and  at least 4 weekly INR s/p cardioversion.  After that, duration to be re-assessed by Cardiology.    1.  Increase Warfarin to 7.5mg  tonight, 08/25/12 and tomorrow night, Thursday, 08/26/12.  2.  Education:  See below.  3.  Plan to re-check INR and RTC on 08/25/12 for follow-up.    Anticoagulation Dose Instructions as of 08/25/2012      Total Sun Mon Tue Wed Thu Fri Sat    Weekly Dose 25 mg 0 mg 5 mg 5 mg 7.5 mg 7.5 mg 0 mg 0 mg      Description         Re-started on 08/23/12        EDUCATION  -Primary learner(s): Patient  -Patient challenges to learning: None  -Challenges impacting this teaching session: None  -Education materials provided: Warfarin dosing calendar card  -Post education response: states understanding    Topics Discussed:    Warfarin  Warfarin discussion topics included: -Identification of generic and brand names  -Purpose of warfarin therapy  -Estimated length of therapy and warfarin  -Explanation of how warfarin therapy works  -When to take/administer doses and importance of compliance  -What to do when dose is missed/late  -Importance of prothrombin time/INR  monitoring  -Recognition of signs and symptoms of bleeding  -Recognition of signs and symptoms venous thromboembolism/pulmonary embolism    -Activities that may increase risk of bleeding  -What to do in case of thromboembolism and/or bleeding occurs  -Recognition of signs and symptoms of disease states that influence warfarin dosing  -Dietary/nutritional consideration, including alcohol  -Effects of exercise on warfarin therapy  -Potential for interaction with Rx, OTC/herbal medication  -Current patient medications with potential for interaction   -General pain medications that can be used with warfarin therapy  -Current dose of warfarin and regimen  -Visual recognition of drug and tablet strength  -Significance of informing other health care providers that warfarin has been prescribed  -When/where/with whom follow up will be provided    Warfarin  information was taught by: providing verbal information    TIME SPENT  -Total time spent teaching: 15 minutes  -Total visit time: 25 minutes

## 2012-08-27 ENCOUNTER — Ambulatory Visit (HOSPITAL_BASED_OUTPATIENT_CLINIC_OR_DEPARTMENT_OTHER): Payer: No Typology Code available for payment source | Attending: Internal Medicine

## 2012-08-27 ENCOUNTER — Ambulatory Visit: Payer: Self-pay

## 2012-08-27 DIAGNOSIS — I4891 Unspecified atrial fibrillation: Secondary | ICD-10-CM

## 2012-08-27 DIAGNOSIS — Z7901 Long term (current) use of anticoagulants: Secondary | ICD-10-CM | POA: Insufficient documentation

## 2012-08-27 LAB — PROTHROMBIN TIME
Prothrombin INR: 1.5 — ABNORMAL HIGH (ref 0.8–1.3)
Prothrombin Time Patient: 17.9 s — ABNORMAL HIGH (ref 10.7–15.6)

## 2012-08-27 MED ORDER — ENOXAPARIN SODIUM 80 MG/0.8ML IJ SOSY
PREFILLED_SYRINGE | INTRAMUSCULAR | Status: DC
Start: 2012-08-27 — End: 2012-09-03

## 2012-08-27 NOTE — Telephone Encounter (Signed)
Message copied by Shaune Spittle on Fri Aug 27, 2012  7:05 PM  ------       Message from: Sherlean Foot       Created: Fri Aug 27, 2012  6:38 PM       Regarding: Medication question         >> DENNIVER Sharlee Blew 08/27/2012 06:38 PM       Medication question         ------

## 2012-08-27 NOTE — Telephone Encounter (Signed)
Protocol: MEDICATION QUESTION CALL-ADULT-AH  Affirmative: Caller has medication question only, adult not sick, and triager answers question  Disposition of Home Care: Information or Advice Only Call suggested.

## 2012-08-27 NOTE — Progress Notes (Addendum)
ID  Visit type: Appointed  Interpreter present: None  Referral type: Written by Dr. Marye Round on 08/19/2012 for anticoagulation.  PCP: Dr. Carlynn Herald, [facility], [phone]  Other Managing Provider: Dr. Marye Round, [facility]    Pt Contact Info: [phone]    Anticoagulation Information  Goal INR: 2-3  Indication: A-Fib (CHADS2 = 1, CHF)  Duration: TBD--prior to cardioversion and at least 4 weekly INRs thereafter; duration to then be re-assessed by Cardiology    Warfarin  Warfarin tablet size: 5mg  and 1mg **GENERIC WARFARIN**filled at [facility] in [facility]  Weekly dose as of last visit: ~26 mg so far  Dose as of last visit: 5 mg, 6 mg, 7.5 mg, 7.5 mg    SUBJECTIVE  Mark Poole is a 62 year old male here today to see the pharmacist for anticoagulation.  Reports taking warfarin as above (5 mg tablets and 1 mg tablets). Denies missed or extra doses, but possibly took 1 mg instead of 5 mg one of the days. Takes in the evening.  Denies medication/OTC changes. Updated his medication list today.  Greens ~2x per week (salad, broccoli), denies changes in diet. 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 No alcohol, quit in January. No smoking, quit 10 years ago.  Small bruise from blood draw, no other signs/symptoms of bruising or bleeding. Denies signs/symptoms of stroke, DVT/PE.  Denies illness or ER visit.    His cardioversion Monday 6/9 at 8:30AM. Per Cardiology he is to be fully anticoagulated prior to the procedure.    LIFESTYLE  -Diet: Denies change in diet  -Substance Use: Denies current alcohol and tobacco use (quit ETOH 03/2012, quit smoking ~2004)  -Exercise: No changes in activity level  -Other: works as a Music therapist    ALLERGIES  Review of patient's allergies indicates:  No known allergies    MEDICATIONS (patient carries a list with him)  --metoprolol succinate 100 mg take 2 tablets BID  --lisinopril 10 mg take 1 tablet once  daily  --furosemide 40 mg take 1 tablet BID  --digoxin 0.125 take 1/2 tablet daily  --warfarin (was switched to aspirin 325 mg x 1-2 months)    Medications reviewed on:  08/27/2012     REVIEW OF SYSTEMS  CONSTITUTIONAL: Denies symptoms  -ANTICOAGULATION:        -BLEEDING SYMPTOMS: Denies symptoms       -THROMBOEMBOLISM SYMPTOMS            -STROKE: Denies symptoms          RESULTS REVIEW  INR      1.5   08/27/2012  INR      1.3   08/25/2012  INR      2.5   06/08/2012    ASSESSMENT/PLAN  INR subtherapeutic, likely to to initiation of therapy and possibly mistaking 1 mg and 5 mg tablets  Per Cardiology we need therapeutic anticoagulation prior to cardioversion, so we will cover him with enoxaparin injections.    1.  Continue warfarin 7.5 mg daily x 3 days (6/6, 6/7, 6/8) then 5 mg daily -- instructed him to put his 1 mg tablets in a different place so he does not mix them up  2. Start enoxaparin 80 mg every 12 hours tonight -- reviewed injection technique  3. INR check prior to his procedure on Monday 08/30/2012 -- Cardiology to determine whether he needs enoxaparin injection that day based on INR. Patient will bring his enoxaparin injections to his  appointment and follow their instructions.  4. Will plan to re-check INR and BMP (renal function borderline moderate)as well as CBC (hct and plts currently wnl and stable), and follow-up on 09/01/12 at [facility] for further Warfarin dosing adjustments and instructions.    5. If patient still needing to remain on Enoxaparin, continue to monitor renal function and weight, and plan to adjust Enoxaparin dosing if necessary as well.      Note: Will plan for at least 4 weekly INRs post cardioversion.  After that, duration to be re-assessed by Cardiology.    EDUCATION  -Primary learner(s): Patient  -Patient challenges to learning: None  -Challenges impacting this teaching session: None  -Education materials provided: Warfarin dosing card  -Post education response: states understanding    Topics  Discussed:    Warfarin  Warfarin discussion topics included: -When to take/administer doses and importance of compliance  -Importance of prothrombin time/INR monitoring  -Recognition of signs and symptoms of bleeding  -Recognition of signs and symptoms stroke, venous thromboembolism/pulmonary embolism  -Current dose of warfarin and regimen  -When/where/with whom follow up will be provided    Warfarin information was taught by: providing verbal information    LMWH  LMWH discussion topics included: -Rationale for LMWH therapy  -When to administer doses/importance of compliance  -Signs and symptoms of bleeding  -Proper injection technique  -Estimated length of therapy with LMWH     LMWH information was taught by: providing verbal information    TIME SPENT  -Total time spent teaching: 25 minutes  -Total visit time: 40 minutes

## 2012-08-27 NOTE — Telephone Encounter (Signed)
Pt Enoxaparin Sodium 80 MG/0.8ML Subcutaneous not at pharmacy as pt was told to pick up.    Reviewed chart.  Pharmacy generated script    Called D/C/consult pharmacy.  No answer x 10 min    Call inpt pharmacy, [facility], script did not get sent.  He will send

## 2012-08-27 NOTE — Patient Instructions (Addendum)
DOSE INSTRUCTIONS    This is the current bridging schedule for Mark Poole as of 08/27/2012    Procedure scheduled for:  08/30/12 at 8:30AM    Warfarin 5 mg tablets  Enoxaparin 80 mg syringes    Date Warfarin dose  (mg) Lovenox  (enoxaparin)  dose Lab  Draw   6/6 7.5 AM: none  PM: 80 mg    6/7 7.5 AM: 80 mg  PM: 80 mg    6/8 7.5 AM: 80 mg  PM: 80 mg    6/9 5 AM: 80 mg  PM: 80 mg INR draw at procedure     If your INR is greater than 2 on 6/9, please stop the Lovenox shots and continue warfarin 5 mg daily.    If your INR is less than 2 on 6/9, please continue Lovenox shots and warfarin 5 mg daily.    See the pharmacist on 09/01/12 (Wednesday) at [facility] for follow-up appointment.    Call [phone] if there are any questions or concerns about your medication or blood thinner.

## 2012-08-28 NOTE — Progress Notes (Addendum)
Patient was seen by pharmacy resident today. I have discussed the patient case with the resident, and agree with the assessment and plan.      I have ordered a CBC and BMP in addition to INR check for next follow-up.  Renal function borderline moderate most recently, will continue to monitor.  If patient needs to remain on Enoxaparin, will adjust Enoxaparin dosing if necessary next follow-up.  Hct and Plts wnl and stable, continue to monitor as well if patient stays on Enoxaparin.    Janalyn Rouse, PharmD

## 2012-08-28 NOTE — Addendum Note (Signed)
Addended by: Johny Blamer on: 08/28/2012 03:58 PM     Modules accepted: Orders, Level of Service, SmartSet

## 2012-08-30 ENCOUNTER — Ambulatory Visit (HOSPITAL_COMMUNITY): Payer: No Typology Code available for payment source | Admitting: Internal Medicine

## 2012-08-30 ENCOUNTER — Other Ambulatory Visit (HOSPITAL_COMMUNITY): Payer: Self-pay

## 2012-08-30 ENCOUNTER — Other Ambulatory Visit (HOSPITAL_BASED_OUTPATIENT_CLINIC_OR_DEPARTMENT_OTHER): Payer: Self-pay

## 2012-08-30 ENCOUNTER — Ambulatory Visit (HOSPITAL_BASED_OUTPATIENT_CLINIC_OR_DEPARTMENT_OTHER)
Admission: RE | Admit: 2012-08-30 | Discharge: 2012-08-30 | Disposition: A | Discharge status: Home / Self Care | Payer: No Typology Code available for payment source | Attending: Critical Care Medicine | Admitting: Critical Care Medicine

## 2012-08-30 DIAGNOSIS — Z7901 Long term (current) use of anticoagulants: Secondary | ICD-10-CM | POA: Insufficient documentation

## 2012-08-30 DIAGNOSIS — I4891 Unspecified atrial fibrillation: Secondary | ICD-10-CM | POA: Insufficient documentation

## 2012-08-30 DIAGNOSIS — I513 Intracardiac thrombosis, not elsewhere classified: Secondary | ICD-10-CM | POA: Insufficient documentation

## 2012-08-30 DIAGNOSIS — R9431 Abnormal electrocardiogram [ECG] [EKG]: Secondary | ICD-10-CM

## 2012-08-30 DIAGNOSIS — I509 Heart failure, unspecified: Secondary | ICD-10-CM

## 2012-08-30 LAB — BASIC METABOLIC PANEL
Anion Gap: 4 (ref 3–11)
Calcium: 9.1 mg/dL (ref 8.9–10.2)
Carbon Dioxide, Total: 29 mEq/L (ref 22–32)
Chloride: 106 mEq/L (ref 98–108)
Creatinine: 0.93 mg/dL (ref 0.51–1.18)
GFR, Calc, African American: >60 mL/min (ref >59)
GFR, Calc, European American: >60 mL/min (ref >59)
Glucose: 114 mg/dL (ref 62–125)
Potassium: 4.3 mEq/L (ref 3.7–5.2)
Sodium: 139 mEq/L (ref 136–145)
Urea Nitrogen: 13 mg/dL (ref 8–21)

## 2012-08-30 LAB — CBC (HEMOGRAM)
Hematocrit: 40 % (ref 38–50)
Hemoglobin: 13 g/dL (ref 13.0–18.0)
MCH: 31 pg (ref 27.3–33.6)
MCHC: 32.3 g/dL (ref 32.2–36.5)
MCV: 96 fL (ref 81–98)
Platelet Count: 175 10*3/uL (ref 150–400)
RBC: 4.2 mil/uL — ABNORMAL LOW (ref 4.40–5.60)
RDW-CV: 14.8 % — ABNORMAL HIGH (ref 11.6–14.4)
WBC: 6 10*3/uL (ref 4.30–10.00)

## 2012-08-30 LAB — PROTHROMBIN & PTT
Partial Thromboplastin Time: 47 s — ABNORMAL HIGH (ref 22–35)
Prothrombin INR: 2.4 — ABNORMAL HIGH (ref 0.8–1.3)
Prothrombin Time Patient: 25.4 s — ABNORMAL HIGH (ref 10.7–15.6)

## 2012-08-30 LAB — DIGOXIN: Digoxin: 0.5 ng/mL (ref 0.5–1.9)

## 2012-08-30 LAB — MAGNESIUM: Magnesium: 1.9 mg/dL (ref 1.8–2.4)

## 2012-09-01 ENCOUNTER — Ambulatory Visit (HOSPITAL_BASED_OUTPATIENT_CLINIC_OR_DEPARTMENT_OTHER): Payer: No Typology Code available for payment source | Attending: Internal Medicine | Admitting: Pharmacist

## 2012-09-01 DIAGNOSIS — Z7901 Long term (current) use of anticoagulants: Secondary | ICD-10-CM | POA: Insufficient documentation

## 2012-09-01 DIAGNOSIS — I4891 Unspecified atrial fibrillation: Secondary | ICD-10-CM | POA: Insufficient documentation

## 2012-09-01 DIAGNOSIS — Z5181 Encounter for therapeutic drug level monitoring: Secondary | ICD-10-CM | POA: Insufficient documentation

## 2012-09-01 LAB — BASIC METABOLIC PANEL
Anion Gap: 8 (ref 3–11)
Anion Gap: 8 (ref 3–11)
Calcium: 9.5 mg/dL (ref 8.9–10.2)
Calcium: 9.5 mg/dL (ref 8.9–10.2)
Carbon Dioxide, Total: 29 mEq/L (ref 22–32)
Carbon Dioxide, Total: 29 mEq/L (ref 22–32)
Chloride: 99 mEq/L (ref 98–108)
Chloride: 99 mEq/L (ref 98–108)
Creatinine: 1.26 mg/dL — ABNORMAL HIGH (ref 0.51–1.18)
Creatinine: 1.26 mg/dL — ABNORMAL HIGH (ref 0.51–1.18)
GFR, Calc, African American: >60 mL/min (ref >59)
GFR, Calc, African American: >60 mL/min (ref >59)
GFR, Calc, European American: 58 mL/min — ABNORMAL LOW (ref >59)
GFR, Calc, European American: 58 mL/min — ABNORMAL LOW (ref >59)
Glucose: 99 mg/dL (ref 62–125)
Glucose: 99 mg/dL (ref 62–125)
Potassium: 4.8 mEq/L (ref 3.7–5.2)
Potassium: 4.8 mEq/L (ref 3.7–5.2)
Sodium: 136 mEq/L (ref 136–145)
Sodium: 136 mEq/L (ref 136–145)
Urea Nitrogen: 18 mg/dL (ref 8–21)
Urea Nitrogen: 18 mg/dL (ref 8–21)

## 2012-09-01 LAB — PROTHROMBIN TIME
Prothrombin INR: 2.7 — ABNORMAL HIGH (ref 0.8–1.3)
Prothrombin Time Patient: 28.3 s — ABNORMAL HIGH (ref 10.7–15.6)

## 2012-09-01 LAB — CBC (HEMOGRAM)
Hematocrit: 43 % (ref 38–50)
Hemoglobin: 14.1 g/dL (ref 13.0–18.0)
MCH: 31.2 pg (ref 27.3–33.6)
MCHC: 32.8 g/dL (ref 32.2–36.5)
MCV: 95 fL (ref 81–98)
Platelet Count: 174 10*3/uL (ref 150–400)
RBC: 4.52 mil/uL (ref 4.40–5.60)
RDW-CV: 14.7 % — ABNORMAL HIGH (ref 11.6–14.4)
WBC: 6.55 10*3/uL (ref 4.30–10.00)

## 2012-09-01 NOTE — Progress Notes (Signed)
ID  Visit type: Appointed  Interpreter present: None  Referral type: Written by Dr. Marye Round on 08/19/2012 for anticoagulation.  PCP: Dr. Carlynn Herald, [facility], [phone]    Other Managing Provider: Dr. Marye Round, [facility]    Pt Contact Info: [phone]    Anticoagulation Information  Goal INR: 2-3  Indication: A-Fib (CHADS2 = 1, CHF)  Duration: Chronic;  At least 3 consecutive weekly therapeutic INRs prior to cardioversion, then at least 4 weekly INRs thereafter; then every 4-8 weeks if stable    Warfarin  Warfarin tablet size: 5mg **GENERIC WARFARIN**filled at [facility] in [facility]  Weekly dose as of last visit: Initiation--50mg  over past 7 day period  Dose as of last visit: 5mg  Monday and 7.5mg  all other days of the week.    SUBJECTIVE  Mark Poole is a 62 year old male here today to see the pharmacist for anticoagulation follow-up. Reports taking warfarin as above, denies any missed doses. Takes in the evening.  Denies medication/OTC changes. Per Cardiology, patient is to be cardioverted.  Will plan for weekly INRs until at least 3 consecutive therapeutic weeks prior to cardioversion, and 4 weekly INRs s/p cardioversion.  Then every 4-8 weeks if stable.    LIFESTYLE  -Diet: Denies change in diet  -Substance Use: Denies current alcohol and tobacco use (quit ETOH 03/2012, quit smoking ~2004)  -Exercise: No changes in activity level  -Other: works as a Music therapist    ALLERGIES  Review of patient's allergies indicates:  No known allergies    MEDICATIONS (patient carries a list with him)  --metoprolol succinate 100 mg take 2 tablets BID  --lisinopril 10 mg take 1 tablet once daily  --furosemide 40 mg take 1 tablet BID  --digoxin 0.125 take 1/2 tablet daily  --warfarin (was switched to aspirin 325 mg x 1-2 months)    OTCs:  Fish Oil  Magnesium  Potassium?    Medications reviewed on:  09/01/2012     REVIEW OF SYSTEMS  CONSTITUTIONAL: Denies  symptoms  -ANTICOAGULATION:        -BLEEDING SYMPTOMS: Denies symptoms       -THROMBOEMBOLISM SYMPTOMS            -STROKE: Denies symptoms          RESULTS REVIEW  INR      2.7   09/01/2012  INR      2.4   08/30/2012  INR      1.5   08/27/2012    ASSESSMENT/PLAN  INR therapeutic.  Patient is to be cardioverted.  Will plan for weekly INRs until at least 3 consecutive weeks of therapeutic INRs prior to cardioversion, and 4 weekly INRs s/p cardioversion.  Then every 4-8 weeks if stable.     1.  Continue warfarin 5mg  Mondays and 7.5mg  all other days of the week.    2.  Will plan to re-check INR and follow-up in 1 week on 09/08/12 at 3:00PM.    Next Cardiology RN: 09/02/2012  Next Cardiology: 09/16/2012 w/Dr. Lucina Mellow    Anticoagulation Dose Instructions as of 09/01/2012      Total Sun Mon Tue Wed Thu Fri Sat    Weekly Dose 50 mg 7.5 mg 5 mg 7.5 mg 7.5 mg 7.5 mg 7.5 mg 7.5 mg        EDUCATION  -Primary learner(s): Patient  -Patient challenges to learning: None  -Challenges impacting this teaching session: None  -Education materials provided: Warfarin dosing card  -Post education  response: states understanding    Topics Discussed:    Warfarin  Warfarin discussion topics included: -When to take/administer doses and importance of compliance  -Importance of prothrombin time/INR monitoring  -Recognition of signs and symptoms of bleeding  -Recognition of signs and symptoms stroke, venous thromboembolism/pulmonary embolism  -Current dose of warfarin and regimen  -When/where/with whom follow up will be provided    Warfarin information was taught by: providing verbal information    TIME SPENT  -Total time spent teaching: 10 minutes  -Total visit time: 15 minutes

## 2012-09-02 ENCOUNTER — Ambulatory Visit (HOSPITAL_BASED_OUTPATIENT_CLINIC_OR_DEPARTMENT_OTHER): Payer: No Typology Code available for payment source | Attending: Internal Medicine

## 2012-09-02 VITALS — BP 98/78 | HR 82 | Resp 18 | Wt 183.4 lb

## 2012-09-02 DIAGNOSIS — I509 Heart failure, unspecified: Secondary | ICD-10-CM | POA: Insufficient documentation

## 2012-09-02 DIAGNOSIS — I4891 Unspecified atrial fibrillation: Secondary | ICD-10-CM | POA: Insufficient documentation

## 2012-09-02 LAB — PROTHROMBIN TIME
Prothrombin INR: 2.7 — ABNORMAL HIGH (ref 0.8–1.3)
Prothrombin Time Patient: 28.1 s — ABNORMAL HIGH (ref 10.7–15.6)

## 2012-09-02 LAB — B_TYPE NATRIURETIC PEPTIDE: B_Type Natriuretic Peptide: 979 pg/mL — ABNORMAL HIGH (ref <101)

## 2012-09-02 MED ORDER — LISINOPRIL 10 MG OR TABS
ORAL_TABLET | ORAL | Status: DC
Start: 2012-09-02 — End: 2012-11-10

## 2012-09-03 ENCOUNTER — Encounter (HOSPITAL_BASED_OUTPATIENT_CLINIC_OR_DEPARTMENT_OTHER): Payer: Self-pay

## 2012-09-03 DIAGNOSIS — I5020 Unspecified systolic (congestive) heart failure: Secondary | ICD-10-CM | POA: Insufficient documentation

## 2012-09-03 DIAGNOSIS — F1011 Alcohol abuse, in remission: Secondary | ICD-10-CM | POA: Insufficient documentation

## 2012-09-03 NOTE — Progress Notes (Signed)
Patient is here for a Nurse Visit on 09/02/2012 as ordered by Dr. Lucina Mellow on 08/30/2012 after TEE for cardioversion. Patient has a small left atrial thrombus and cardioversion was cancelled. Patient also appeared volume up and his lasix was increased, RN visit made with chem 7 and BNP to reassess fluid status.     Patient reports he made lasix increase as ordered, and is now taking lasix 40mg , PO, BID. Patient reporst he is much better then he was on Monday. Patient reports that last weekend he ate out at a [employer]. Patient reports he noticed more urinating when he took and extra dose of lasix for a total of 80mg . Patient reports he is taking all his medication, he brought a list of them today. Patient is working  three hours a day, reports he is still using ladders and power tools but is very aware of risk with coumadin. Patient denies chest pain or pressure, no dizziness, syncope or near syncope. Patient does get short of breath with going up stairs but not when walking on flat ground, denies palpitations. Patient does not report orthopnea, PND symptoms or BLE edema. Patient abdomen is not distended, no fluid wave noted, patient reports it is much smaller then on Monday.     Irregular rhythm, rate controlled. No BLE edema. Abd. Round, soft, NT. Lungs CTA.       Vitals:  Filed Vitals:    09/02/12 1130   BP: 98/78   Pulse: 82   Resp: 18   Weight: 183 lb 6.8 oz (83.2 kg)   SpO2: 96%     Weight at clinic visit on 5/1 83.6kg <4/8 87.3kg    Medications:  Current Outpatient Prescriptions   Medication Sig   . Digoxin (DIGOX) 125 MCG Oral Tab 1 TABLET DAILY   . Furosemide 40 MG Oral Tab 1 TABLET DAILY, BID   . Lisinopril 10 MG Oral Tab Take 2 tablet by mouth daily. INCREASED TODAY   . Metoprolol Succinate ER 100 MG Oral TABLET SR 24 HR 1 tablet, BID   . Warfarin Sodium 5 MG Oral Tab Take 1 tablet by mouth daily or as directed by the Anticoagulation Clinic.     No current facility-administered medications for this  visit.       Lab Results:      Results for orders placed in visit on 08/30/12   BASIC METABOLIC PANEL       Result Value Range    Sodium 136  136 - 145 mEq/L    Potassium 4.8  3.7 - 5.2 mEq/L    Chloride 99  98 - 108 mEq/L    Carbon Dioxide, Total 29  22 - 32 mEq/L    Anion Gap 8  3 - 11    Glucose 99  62 - 125 mg/dL    Urea Nitrogen 18  8 - 21 mg/dL    Creatinine 1.61 (*) 0.51 - 1.18 mg/dL    Calcium 9.5  8.9 - 09.6 mg/dL    Gfr, Calc, European American 58 (*) >59 mL/min    GFR, Calc, African American >60  >59 mL/min    GFR, Information        Value: Calculated GFR in mL/min/1.73 m2 by MDRD equation.      Inaccurate with changing renal function.  See      [email or web address]       Results for orders placed in visit on 09/02/12   B_TYPE NATRIURETIC PEPTIDE  Result Value Range    B_Type Natriuretic Peptide 979 (*) <101 pg/mL         Plan:  Spoke to Dr. Lucina Mellow who ordered below:  1. Increase Lisinopril to 20mg , PO, once daily. Script was escripted to [facility].  2. RN visit in one week for BP check and labs.    Patient understood plan and has clinic number to call with concerns.

## 2012-09-08 ENCOUNTER — Other Ambulatory Visit (HOSPITAL_BASED_OUTPATIENT_CLINIC_OR_DEPARTMENT_OTHER): Payer: Self-pay

## 2012-09-08 ENCOUNTER — Ambulatory Visit (HOSPITAL_BASED_OUTPATIENT_CLINIC_OR_DEPARTMENT_OTHER): Payer: No Typology Code available for payment source | Attending: Internal Medicine | Admitting: Pharmacist

## 2012-09-08 ENCOUNTER — Other Ambulatory Visit (HOSPITAL_COMMUNITY): Payer: Self-pay | Admitting: Family Medicine

## 2012-09-08 DIAGNOSIS — Z5181 Encounter for therapeutic drug level monitoring: Secondary | ICD-10-CM

## 2012-09-08 DIAGNOSIS — I513 Intracardiac thrombosis, not elsewhere classified: Secondary | ICD-10-CM

## 2012-09-08 DIAGNOSIS — Z7901 Long term (current) use of anticoagulants: Secondary | ICD-10-CM | POA: Insufficient documentation

## 2012-09-08 DIAGNOSIS — I4891 Unspecified atrial fibrillation: Secondary | ICD-10-CM | POA: Insufficient documentation

## 2012-09-08 DIAGNOSIS — I5189 Other ill-defined heart diseases: Secondary | ICD-10-CM | POA: Insufficient documentation

## 2012-09-08 LAB — PROTHROMBIN TIME
Prothrombin INR: 4.5 — ABNORMAL HIGH (ref 0.8–1.3)
Prothrombin Time Patient: 42.2 s — ABNORMAL HIGH (ref 10.7–15.6)

## 2012-09-08 NOTE — Progress Notes (Signed)
ID  Visit type: Appointed  Interpreter present: None  Referral type: Written by Dr. Marye Round on 08/19/2012 for anticoagulation.  PCP: Dr. Carlynn Herald, [facility], [phone]    Other Managing Provider: Dr. Marye Round, [facility]    Pt Contact Info: [phone]    Anticoagulation Information  Goal INR: 2-3  Indication: A-Fib (CHADS2 = 1, CHF)  Duration: Chronic;  At least 3 consecutive weekly therapeutic INRs prior to cardioversion, then at least 4 weekly INRs thereafter; then every 4-8 weeks if stable    Warfarin  Warfarin tablet size: 5mg **GENERIC WARFARIN**filled at [facility] in [facility]  Weekly dose as of last visit: Initiation--50mg  over past 7 day period  Dose as of last visit: 5mg  Monday and 7.5mg  all other days of the week.    SUBJECTIVE  Mark Poole is a 62 year old male here today to see the pharmacist for anticoagulation follow-up. Reports taking warfarin as above, denies any missed doses. Takes in the evening.  Denies medication/OTC changes. Per Cardiology, patient is to be cardioverted.  Will plan for weekly INRs until at least 3 consecutive therapeutic weeks prior to cardioversion, and 4 weekly INRs s/p cardioversion.  Then every 4-8 weeks if stable.    LIFESTYLE  -Diet: Denies change in diet  -Substance Use: Denies current alcohol and tobacco use (quit ETOH 03/2012, quit smoking ~2004)  -Exercise: No changes in activity level  -Other: works as a Music therapist    ALLERGIES  Review of patient's allergies indicates:  No known allergies    MEDICATIONS (patient carries a list with him)  --metoprolol succinate 100 mg take 2 tablets BID  --lisinopril 10 mg take 2 tablets once daily  --furosemide 40 mg take 1 tablet BID  --digoxin 0.125 take 1/2 tablet daily  --warfarin (was switched to aspirin 325 mg x 1-2 months)    OTCs:  Fish Oil  Magnesium  Potassium?    Medications reviewed on:  09/08/2012     REVIEW OF SYSTEMS  CONSTITUTIONAL: Denies  symptoms  -ANTICOAGULATION:        -BLEEDING SYMPTOMS: Denies symptoms       -THROMBOEMBOLISM SYMPTOMS            -STROKE: Denies symptoms          RESULTS REVIEW  INR      4.5   09/08/2012  INR      2.7   09/02/2012  INR      2.7   09/01/2012    ASSESSMENT/PLAN  INR supratherapeutic.  Will hold and reduce dose (see below).  Patient is to be cardioverted.  Will plan for weekly INRs until at least 3 consecutive weeks of therapeutic INRs prior to cardioversion, and 4 weekly INRs s/p cardioversion.  Then every 4-8 weeks if stable.     1.  HOLD Warfarin x1 tonight, then reduce to 7.5mg  MWFSun and 5mg  TTSat (45mg /week).    2.  Will plan to re-check INR and follow-up in 1 week on 09/16/12 at 1:30pm prior to his Cardiology follow-up w/Dr. Lucina Mellow same day.    Next Cardiology RN: 09/10/2012  Next Cardiology: 09/16/2012 w/Dr. Lucina Mellow    Anticoagulation Dose Instructions as of 09/08/2012      Total Sun Mon Tue Wed Thu Fri Sat    Weekly Dose 45 mg 7.5 mg 7.5 mg 5 mg 7.5 mg 5 mg 7.5 mg 5 mg      Description         Hold x1 tonight,  then reduce dose as listed above.        EDUCATION  -Primary learner(s): Patient  -Patient challenges to learning: None  -Challenges impacting this teaching session: None  -Education materials provided: Warfarin dosing card  -Post education response: states understanding    Topics Discussed:    Warfarin  Warfarin discussion topics included: -When to take/administer doses and importance of compliance  -Importance of prothrombin time/INR monitoring  -Recognition of signs and symptoms of bleeding  -Recognition of signs and symptoms stroke, venous thromboembolism/pulmonary embolism  -Current dose of warfarin and regimen  -When/where/with whom follow up will be provided    Warfarin information was taught by: providing verbal information    TIME SPENT  -Total time spent teaching: 10 minutes  -Total visit time: 15 minutes

## 2012-09-10 ENCOUNTER — Other Ambulatory Visit (HOSPITAL_BASED_OUTPATIENT_CLINIC_OR_DEPARTMENT_OTHER): Payer: Self-pay

## 2012-09-10 ENCOUNTER — Ambulatory Visit (HOSPITAL_BASED_OUTPATIENT_CLINIC_OR_DEPARTMENT_OTHER): Payer: No Typology Code available for payment source | Attending: Internal Medicine

## 2012-09-10 VITALS — BP 89/62 | HR 69 | Wt 180.6 lb

## 2012-09-10 DIAGNOSIS — I509 Heart failure, unspecified: Secondary | ICD-10-CM

## 2012-09-10 LAB — BASIC METABOLIC PANEL
Anion Gap: 6 (ref 3–11)
Calcium: 9.5 mg/dL (ref 8.9–10.2)
Carbon Dioxide, Total: 30 mEq/L (ref 22–32)
Chloride: 105 mEq/L (ref 98–108)
Creatinine: 1.25 mg/dL — ABNORMAL HIGH (ref 0.51–1.18)
GFR, Calc, African American: >60 mL/min (ref >59)
GFR, Calc, European American: 58 mL/min — ABNORMAL LOW (ref >59)
Glucose: 110 mg/dL (ref 62–125)
Potassium: 4.2 mEq/L (ref 3.7–5.2)
Sodium: 141 mEq/L (ref 136–145)
Urea Nitrogen: 26 mg/dL — ABNORMAL HIGH (ref 8–21)

## 2012-09-10 LAB — B_TYPE NATRIURETIC PEPTIDE: B_Type Natriuretic Peptide: 1085 pg/mL — ABNORMAL HIGH (ref <101)

## 2012-09-10 NOTE — Progress Notes (Signed)
Patient is here for a Nurse Visit on 09/10/2012 as ordered by Dr. Lucina Mellow on 09/02/2012. At this time patient was increased on his lisinopril to 20 mg, PO, once daily.    Patient reports he increased his lisinopril. Patient reports he has been feeling stronger. He denies chest pain, reports he is breathing better. No symptoms of heart failure like last visit. Patient denies dizziness, syncope or near syncope. No orthopnea, PND and no BLE edema. Denies abdominal bloating.     Irregular rhythm, rate controlled. No BLE edema. Lungs CTA. Abdomen round, soft, NT.    Vitals:  Filed Vitals:    09/10/12 1603   BP: 89/62   Pulse: 69   Weight: 180 lb 8.9 oz (81.9 kg)     Last weight on 6/12  83.2kg <= 5/1 83.6kg <= 4/8 87.3kg      Medications:  Current Outpatient Prescriptions   Medication Sig   . Digoxin (DIGOX) 125 MCG Oral Tab 1 TABLET DAILY   . Furosemide 40 MG Oral Tab 1 TABLET DAILY, BID   . Lisinopril 10 MG Oral Tab Take 2 tablet by mouth daily   . Metoprolol Succinate ER 100 MG Oral TABLET SR 24 HR 1 tablet, BID   . Warfarin Sodium 5 MG Oral Tab Take 1 tablet by mouth daily or as directed by the Anticoagulation Clinic.     No current facility-administered medications for this visit.       Lab Results:      Results for orders placed in visit on 09/10/12   BASIC METABOLIC PANEL       Result Value Range    Sodium 141  136 - 145 mEq/L    Potassium 4.2  3.7 - 5.2 mEq/L    Chloride 105  98 - 108 mEq/L    Carbon Dioxide, Total 30  22 - 32 mEq/L    Anion Gap 6  3 - 11    Glucose 110  62 - 125 mg/dL    Urea Nitrogen 26 (*) 8 - 21 mg/dL    Creatinine 4.54 (*) 0.51 - 1.18 mg/dL    Calcium 9.5  8.9 - 09.8 mg/dL    Gfr, Calc, European American 58 (*) >59 mL/min    GFR, Calc, African American >60  >59 mL/min    GFR, Information        Value: Calculated GFR in mL/min/1.73 m2 by MDRD equation.      Inaccurate with changing renal function.  See      [email or web address]       BNP  1085  <=  979    Plan:  Spoke to Dr. Lucina Mellow who made no medication changes for now. Patient is to return to clinic on 09/16/2012 for visit with Dr. Lucina Mellow.    Patient understood plan and will call if he gets dizzy, or any other symptom changes.

## 2012-09-16 ENCOUNTER — Ambulatory Visit (HOSPITAL_BASED_OUTPATIENT_CLINIC_OR_DEPARTMENT_OTHER): Payer: No Typology Code available for payment source | Admitting: Pharmacist

## 2012-09-16 ENCOUNTER — Encounter (HOSPITAL_BASED_OUTPATIENT_CLINIC_OR_DEPARTMENT_OTHER): Payer: Self-pay | Admitting: Internal Medicine

## 2012-09-16 ENCOUNTER — Ambulatory Visit (HOSPITAL_BASED_OUTPATIENT_CLINIC_OR_DEPARTMENT_OTHER): Payer: No Typology Code available for payment source | Attending: Internal Medicine | Admitting: Internal Medicine

## 2012-09-16 VITALS — BP 111/81 | HR 76 | Temp 96.3°F | Wt 179.8 lb

## 2012-09-16 DIAGNOSIS — I513 Intracardiac thrombosis, not elsewhere classified: Secondary | ICD-10-CM

## 2012-09-16 DIAGNOSIS — I5189 Other ill-defined heart diseases: Secondary | ICD-10-CM | POA: Insufficient documentation

## 2012-09-16 DIAGNOSIS — I4891 Unspecified atrial fibrillation: Secondary | ICD-10-CM

## 2012-09-16 DIAGNOSIS — I509 Heart failure, unspecified: Secondary | ICD-10-CM | POA: Insufficient documentation

## 2012-09-16 DIAGNOSIS — Z5181 Encounter for therapeutic drug level monitoring: Secondary | ICD-10-CM | POA: Insufficient documentation

## 2012-09-16 DIAGNOSIS — Z7901 Long term (current) use of anticoagulants: Secondary | ICD-10-CM

## 2012-09-16 DIAGNOSIS — I5020 Unspecified systolic (congestive) heart failure: Secondary | ICD-10-CM

## 2012-09-16 LAB — BASIC METABOLIC PANEL
Anion Gap: 7 (ref 3–11)
Calcium: 9.7 mg/dL (ref 8.9–10.2)
Carbon Dioxide, Total: 28 mEq/L (ref 22–32)
Chloride: 102 mEq/L (ref 98–108)
Creatinine: 1.3 mg/dL — ABNORMAL HIGH (ref 0.51–1.18)
GFR, Calc, African American: >60 mL/min (ref >59)
GFR, Calc, European American: 56 mL/min — ABNORMAL LOW (ref >59)
Glucose: 101 mg/dL (ref 62–125)
Potassium: 4.5 mEq/L (ref 3.7–5.2)
Sodium: 137 mEq/L (ref 136–145)
Urea Nitrogen: 23 mg/dL — ABNORMAL HIGH (ref 8–21)

## 2012-09-16 LAB — PROTHROMBIN TIME
Prothrombin INR: 4.4 — ABNORMAL HIGH (ref 0.8–1.3)
Prothrombin Time Patient: 41 s — ABNORMAL HIGH (ref 10.7–15.6)

## 2012-09-16 LAB — B_TYPE NATRIURETIC PEPTIDE: B_Type Natriuretic Peptide: 1156 pg/mL — ABNORMAL HIGH (ref <101)

## 2012-09-16 NOTE — Progress Notes (Signed)
OUTPATIENT VISIT    ID/CHIEF COMPLAINT: Mark Poole is a 62 year old man here for Atrial Fibrillation       HISTORY:  Doing well with HF and afib. No CP, SOB, palpitations, lightheadedness. Adherent with meds. 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 Feels much better. No specific complaints.    REVIEW OF SYSTEMS:  A complete ROS was performed and is negative, except where noted in the above HPI.    PHYSICAL EXAM:  VITALS - Blood pressure 111/81, pulse 76, temperature 96.3 F (35.7 C), temperature source Tympanic, weight 179 lb 12.8 oz (81.557 kg), SpO2 97.00%.  GENERAL - well-appearing NAD  HEENT - anicteric MMM  RESPIRATORY - CTAB  CARDIOVASCULAR - Irreg irreg MG, nl S1S2, no JVD, 1+ LE edema to ankle, nl carotid upstroke, no carotid or abd bruit, no heave or thrill  ABDOMEN - soft NT  SKIN - warm dry pink  NEURO - face symmetric  MENTAL STATUS - AAOx3    DIAGNOSTIC STUDIES:    ASSESSMENT/PLAN:  Mark Poole was seen today for atrial fibrillation.    Diagnoses and associated orders for this visit:    Atrial fibrillation  Well rate controlled on metoprolol and digoxin, INR supratherapeutic and seen in [facility] today for management.  Will continue INR 2-3 for 3 months then review with him and consider TEE/DCCV. He will investigate costs for novel anticoagulants.    Left atrial thrombus    HFrEF (heart failure with reduced ejection fraction)  Continue current medical therapy for HFrEF. Will not increase ACEI dose today given low BP 2 weeks ago. Would consider uptitration on next visit. EF 36% not candidate for ICD. Will re-evaluate LV systolic function with likely TEE for DCCV but if no TEE will recheck TTE in 3-4 m.    HM  - LIPID PANEL  -  re-establish with PCP    Return in about 3 months (around 12/17/2012).        ------------------------------------------------------------------------------------------------------------------  SUPPLEMENTAL INFO:    PROBLEM LIST:  Patient Active Problem List    Diagnosis Date Noted   .  HFrEF (heart failure with reduced ejection fraction) 09/16/2012   . 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 HFrEF (heart failure with reduced ejection fraction) 09/03/2012     07/08/12 TTE: EF 36%.Left ventricle is normal in size and thickness. Moderate global hypokinesis. Right ventricle is normal in size with moderate systolic dysfunction. Mild-moderate tricuspid regurgitation: estimated pulmonary artery systolic pressures are normal (29-34 mmHg).  Moderate mitral regurgitation.Trileaflet aortic valve with mild valvular stenosis and trace regurgitation.No pericardial effusion       . Left atrial thrombus 08/30/2012     08/30/12. TEE. Small thrombus in the left atrial appendage. A bright immobile structure in the appendage is again visualized, possibly representing a pectinate muscle vs calcified thrombus. Some views show an aspect of this mass that appears consistent with mobile thrombus. There is spontaneous echo contrast in the appendage and left atrium. Cardioversion was thus deferred.          . Atrial fibrillation 08/19/2012        SOCIAL HISTORY:  History     Social History   . Marital Status: Single     Spouse Name: N/A     Number of Children: N/A   . Years of Education: N/A     Occupational History   . Not on file.     Social History Main Topics   . Smoking status: Former Games developer   .  Smokeless tobacco: Not on file   . Alcohol Use: Not on file   . Drug Use: Not on file   . Sexually Active: Not on file     Other Topics Concern   . Not on file     Social History Narrative   . No narrative on file       CURRENT MEDICATIONS:  as of end of visit on 09/16/2012    Current Outpatient Prescriptions   Medication Sig   . Digoxin (DIGOX) 125 MCG Oral Tab 1 TABLET DAILY   . Furosemide 40 MG Oral Tab 1 TABLET DAILY, BID   . Lisinopril 10 MG Oral Tab Take 2 tablet by mouth daily   . Metoprolol Succinate ER 100 MG Oral TABLET SR 24 HR 1 tablet, BID   . Warfarin Sodium 5 MG Oral Tab Take 1 tablet by mouth daily or as directed  by the Anticoagulation Clinic.     No current facility-administered medications for this visit.       ALLERGIES:  Review of patient's allergies indicates:  No Known Allergies     VITAL SIGN TRENDS:       Wt Readings from Last 5 Encounters:   09/16/12 179 lb 12.8 oz (81.557 kg)   09/10/12 180 lb 8.9 oz (81.9 kg)   09/02/12 183 lb 6.8 oz (83.2 kg)          BP Readings from Last 5 Encounters:   09/16/12 111/81   09/10/12 89/62   09/02/12 98/78        STUDIES:  Clinical Support Visit on 09/10/2012   Component Date Value Range Status   . Sodium 09/16/2012 137  136 - 145 mEq/L Final   . Potassium 09/16/2012 4.5  3.7 - 5.2 mEq/L Final   . Chloride 09/16/2012 102  98 - 108 mEq/L Final   . Carbon Dioxide, Total 09/16/2012 28  22 - 32 mEq/L Final   . Anion Gap 09/16/2012 7  3 - 11 Final   . Glucose 09/16/2012 101  62 - 125 mg/dL Final   . Urea Nitrogen 09/16/2012 23* 8 - 21 mg/dL Final   . Creatinine 57/84/6962 1.30* 0.51 - 1.18 mg/dL Final   . Calcium 95/28/4132 9.7  8.9 - 10.2 mg/dL Final   . Gfr, Calc, European American 09/16/2012 56* >59 mL/min Final   . GFR, Calc, African American 09/16/2012 >60  >59 mL/min Final   . GFR, Information 09/16/2012    Final                    Value:Calculated GFR in mL/min/1.73 m2 by MDRD equation.                          Inaccurate with changing renal function.  See                          [email or web address]   . B_Type Natriuretic Peptide 09/16/2012 1156* <101 pg/mL Final

## 2012-09-16 NOTE — Progress Notes (Signed)
ID  Visit type: Appointed  Interpreter present: None  Referral type: Written by Dr. Marye Round on 08/19/2012 for anticoagulation.  PCP: Dr. Carlynn Herald, [facility], [phone]    Other Managing Provider: Dr. Marye Round, [facility]    Pt Contact Info: [phone]    Anticoagulation Information  Goal INR: 2-3  Indication: A-Fib (CHADS2 = 1, CHF)  Duration: Chronic;  At least 3 consecutive weekly therapeutic INRs prior to cardioversion, then at least 4 weekly INRs thereafter; then every 4-8 weeks if stable    Warfarin  Warfarin tablet size: 5mg **GENERIC WARFARIN**filled at [facility] in [facility]  Weekly dose as of last visit: Initiation--45mg   Dose as of last visit: 5mg  Tue,Thurs,Sat. and 7.5mg  all other days of the week.    SUBJECTIVE  Mark Poole is a 62 year old male here today to see the pharmacist for anticoagulation follow-up. Reports taking warfarin as above, denies any missed doses. Takes in the evening.  Denies medication/OTC changes. Per Cardiology, patient is to be cardioverted.  Will plan for weekly INRs until at least 3 consecutive therapeutic weeks prior to cardioversion, and 4 weekly INRs s/p cardioversion.  Then every 4-8 weeks if stable. He is seeing cardiology later today to discuss a plan.    LIFESTYLE  -Diet: Denies change in diet  -Substance Use: Denies current alcohol and tobacco use (quit ETOH 03/2012, quit smoking ~2004)  -Exercise: No changes in activity level  -Other: works as a Music therapist    ALLERGIES  Review of patient's allergies indicates:  No known allergies    MEDICATIONS (patient carries a list with him)  --metoprolol succinate 100 mg take 2 tablets BID  --lisinopril 10 mg take 2 tablets once daily  --furosemide 40 mg take 1 tablet BID  --digoxin 0.125 take 1/2 tablet daily  --warfarin (was switched to aspirin 325 mg x 1-2 months)    OTCs:  Fish Oil  Magnesium  Potassium?    Medications reviewed on:  09/08/2012     REVIEW OF  SYSTEMS  CONSTITUTIONAL: Denies symptoms  -ANTICOAGULATION:        -BLEEDING SYMPTOMS: Denies symptoms       -THROMBOEMBOLISM SYMPTOMS            -STROKE: Denies symptoms          RESULTS REVIEW    Prothrombin INR   Date Value Range Status   09/16/2012 4.4* 0.8 - 1.3 Final   09/08/2012 4.5* 0.8 - 1.3 Final   09/02/2012 2.7* 0.8 - 1.3 Final         ASSESSMENT/PLAN  Still supratherapeutic INR despite holding and lowering his dose. Pt is in the initiation phase. He would also like to start eating kale he grows in his garden which is high in vitamin-K.  1. Will hold 1 dose, then decrease slightly to 42.5mg /week  2. Will recheck INR in 1 week by phone.      Anticoagulation Dose Instructions as of 09/16/2012      Total Sun Mon Tue Wed Thu Fri Sat    Weekly Dose 42.5 mg 5 mg 5 mg 7.5 mg 5 mg 7.5 mg 5 mg 7.5 mg      Description         Hold 1 dose.          EDUCATION  -Primary learner(s): Patient  -Patient challenges to learning: None  -Challenges impacting this teaching session: None  -Education materials provided: None  -Post education response: states understanding  Topics Discussed:    Warfarin  Warfarin discussion topics included: -Dietary/nutritional consideration, including alcohol  -Current dose of warfarin and regimen  -When/where/with whom follow up will be provided    Warfarin information was taught by: providing verbal information      TIME SPENT  -Total time spent teaching: 10 minutes  -Total visit time: 15 minutes

## 2012-09-16 NOTE — Patient Instructions (Addendum)
1. Establish care with a primary doctor  2. Continue your current medications  3. We will consider a repeat TEE-cardioversion in 3 months    Below are alternate anticoagulants for you to check costs.    Apixaban Oral tablet  What is this medicine?  APIXABAN is an anticoagulant (blood thinner). It is used to lower the chance of stroke in people with a medical condition called atrial fibrillation.  This medicine may be used for other purposes; ask your health care provider or pharmacist if you have questions.  What should I tell my health care provider before I take this medicine?  They need to know if you have any of these conditions:   bleeding disorders   bleeding in the brain   blood in your stools (black or tarry stools) or if you have blood in your vomit   history of stomach bleeding   kidney disease   liver disease   mechanical heart valve   an unusual or allergic reaction to apixaban, other medicines, foods, dyes, or preservatives   pregnant or trying to get pregnant   breast-feeding  How should I use this medicine?  Take this medicine by mouth with a glass of water. Follow the directions on the prescription label. You can take it with or without food. If it upsets your stomach, take it with food. Take your medicine at regular intervals. Do not take it more often than directed. Do not stop taking except on your doctor's advice.  Talk to your pediatrician regarding the use of this medicine in children. Special care may be needed.  Overdosage: If you think you've taken too much of this medicine contact a poison control center or emergency room at once.  NOTE: This medicine is only for you. Do not share this medicine with others.  What if I miss a dose?  If you miss a dose, take it as soon as you can. If it is almost time for your next dose, take only that dose. Do not take double or extra doses.  What may interact with this medicine?  This medicine may interact with the following:   aspirin and  aspirin-like medicines   certain medicines for fungal infections like ketoconazole and itraconazole   certain medicines for seizures like carbamazepine and phenytoin   certain medicines that treat or prevent blood clots like warfarin, enoxaparin, and dalteparin   clarithromycin   NSAIDs, medicines for pain and inflammation, like ibuprofen or naproxen   rifampin   ritonavir   St. John's wort  This list may not describe all possible interactions. Give your health care provider a list of all the medicines, herbs, non-prescription drugs, or dietary supplements you use. Also tell them if you smoke, drink alcohol, or use illegal drugs. Some items may interact with your medicine.  What should I watch for while using this medicine?  Do not stop taking this medicine without first talking to your doctor. Stopping this medicine may increase your risk of having a stroke. Be sure to refill your prescription before you run out of medicine.  This medicine may increase your risk to bruise or bleed. Call your doctor or health care professional if you notice any unusual bleeding.  Be careful brushing and flossing your teeth or using a toothpick because you may bleed more easily. If you have any dental work done, tell your dentist you are receiving this medicine.  What side effects may I notice from receiving this medicine?  Side effects that  you should report to your doctor or health care professional as soon as possible:   allergic reactions like skin rash, itching or hives, swelling of the face, lips, or tongue   bloody or black, tarry stools   changes in vision   confusion, trouble speaking or understanding   red or dark-brown urine   red spots on the skin   severe headaches   spitting up blood or brown material that looks like coffee grounds   sudden numbness or weakness of the face, arm or leg   trouble walking, dizziness, loss of balance or coordination   unusual bruising or bleeding from the eye, gums, or  nose  This list may not describe all possible side effects. Call your doctor for medical advice about side effects. You may report side effects to FDA at 1-800-FDA-1088.  Where should I keep my medicine?  Keep out of the reach of children.  Store at room temperature between 20 and 25 degrees C (68 and 77 degrees F). Throw away any unused medicine after the expiration date.  NOTE: This sheet is a summary. It may not cover all possible information. If you have questions about this medicine, talk to your doctor, pharmacist, or health care provider.  NOTE:This sheet is a summary. It may not cover all possible information. If you have questions about this medicine, talk to your doctor, pharmacist, or health care provider. Copyright 2013 Gold Standard        Rivaroxaban Oral tablet  What is this medicine?  RIVAROXABAN (ri va ROX a ban) is an anticoagulant (blood thinner). It is used to treat blood clots in the lungs or in the veins. It is also used after knee or hip surgeries to prevent blood clots. It is also used to lower the chance of stroke in people with a medical condition called atrial fibrillation.  This medicine may be used for other purposes; ask your health care provider or pharmacist if you have questions.  What should I tell my health care provider before I take this medicine?  They need to know if you have any of these conditions:   bleeding disorders   bleeding in the brain   blood in your stools (black or tarry stools) or if you have blood in your vomit   history of stomach bleeding   kidney disease   liver disease   low blood counts, like low white cell, platelet, or red cell counts   recent or planned spinal or epidural procedure   take medicines that treat or prevent blood clots   an unusual or allergic reaction to rivaroxaban, other medicines, foods, dyes, or preservatives   pregnant or trying to get pregnant   breast-feeding  How should I use this medicine?  Take this medicine by mouth  with a glass of water. Follow the directions on the prescription label. Take your medicine at regular intervals. Do not take it more often than directed. Do not stop taking except on your doctor's advice.  If you are taking this medicine after hip or knee replacement surgery, take it with or without food.  If you are taking this medicine for atrial fibrillation, take it with your evening meal. If you are taking this medicine to treat blood clots, take it with food at the same time each day. If you are unable to swallow your tablet, you may crush the tablet and mix it in applesauce. Then, immediately eat the applesauce. You should eat more food right after  you eat the applesauce containing the crushed tablet.  Talk to your pediatrician regarding the use of this medicine in children. Special care may be needed.  Overdosage: If you think you've taken too much of this medicine contact a poison control center or emergency room at once.  NOTE: This medicine is only for you. Do not share this medicine with others.  What if I miss a dose?  If you take your medicine once a day and miss a dose, take the missed dose as soon as you remember. If you take your medicine twice a day and miss a dose, take the missed dose immediately. In this instance, 2 tablets may be taken at the same time. The next day you should take 1 tablet twice a day as directed.  What may interact with this medicine?   aspirin and aspirin-like medicines   certain antibiotics like erythromycin, azithromycin, and clarithromycin   certain medicines for fungal infections like ketoconazole and itraconazole   certain medicines for irregular heart beat like amiodarone, quinidine, dronedarone   certain medicines for seizures like carbamazepine, phenytoin   certain medicines that treat or prevent blood clots like warfarin, enoxaparin, and dalteparin   conivaptan   diltiazem   felodipine   indinavir   lopinavir; ritonavir   NSAIDS, medicines for pain and  inflammation, like ibuprofen or naproxen   ranolazine   rifampin   ritonavir   St. John's wort   verapamil  This list may not describe all possible interactions. Give your health care provider a list of all the medicines, herbs, non-prescription drugs, or dietary supplements you use. Also tell them if you smoke, drink alcohol, or use illegal drugs. Some items may interact with your medicine.  What should I watch for while using this medicine?  Do not stop taking this medicine without first talking to your doctor. Stopping this medicine may increase your risk of having a stroke. Be sure to refill your prescription before you run out of medicine.  This medicine may increase your risk to bruise or bleed. Call your doctor or health care professional if you notice any unusual bleeding.  Be careful brushing and flossing your teeth or using a toothpick because you may bleed more easily. If you have any dental work done, tell your dentist you are receiving this medicine.  What side effects may I notice from receiving this medicine?  Side effects that you should report to your doctor or health care professional as soon as possible:   allergic reactions like skin rash, itching or hives, swelling of the face, lips, or tongue   bloody or black, tarry stools   changes in vision   confusion, trouble speaking or understanding   red or dark-brown urine   redness, blistering, peeling or loosening of the skin, including inside the mouth   severe headaches   spitting up blood or brown material that looks like coffee grounds   sudden numbness or weakness of the face, arm or leg   trouble walking, dizziness, loss of balance or coordination   unusual bruising or bleeding from the eye, gums, or nose  Side effects that usually do not require medical attention (Report these to your doctor or health care professional if they continue or are bothersome.):   dizziness   muscle pain  This list may not describe all possible  side effects. Call your doctor for medical advice about side effects. You may report side effects to FDA at 1-800-FDA-1088.  Where should I  keep my medicine?  Keep out of the reach of children.  Store at room temperature between 15 and 30 degrees C (59 and 86 degrees F). Throw away any unused medicine after the expiration date.  NOTE: This sheet is a summary. It may not cover all possible information. If you have questions about this medicine, talk to your doctor, pharmacist, or health care provider.  NOTE:This sheet is a summary. It may not cover all possible information. If you have questions about this medicine, talk to your doctor, pharmacist, or health care provider. Copyright 2013 Gold Standard

## 2012-09-23 ENCOUNTER — Ambulatory Visit (HOSPITAL_BASED_OUTPATIENT_CLINIC_OR_DEPARTMENT_OTHER)
Admit: 2012-09-23 | Discharge: 2012-09-23 | Disposition: A | Discharge status: Home / Self Care | Payer: No Typology Code available for payment source | Attending: Internal Medicine | Admitting: Internal Medicine

## 2012-09-23 ENCOUNTER — Encounter (HOSPITAL_BASED_OUTPATIENT_CLINIC_OR_DEPARTMENT_OTHER): Payer: No Typology Code available for payment source

## 2012-09-23 DIAGNOSIS — I5189 Other ill-defined heart diseases: Secondary | ICD-10-CM | POA: Insufficient documentation

## 2012-09-23 DIAGNOSIS — Z5181 Encounter for therapeutic drug level monitoring: Secondary | ICD-10-CM | POA: Insufficient documentation

## 2012-09-23 DIAGNOSIS — Z7901 Long term (current) use of anticoagulants: Secondary | ICD-10-CM | POA: Insufficient documentation

## 2012-09-23 DIAGNOSIS — I4891 Unspecified atrial fibrillation: Secondary | ICD-10-CM | POA: Insufficient documentation

## 2012-09-23 LAB — PROTHROMBIN TIME
Prothrombin INR: 2.7 — ABNORMAL HIGH (ref 0.8–1.3)
Prothrombin Time Patient: 28 s — ABNORMAL HIGH (ref 10.7–15.6)

## 2012-09-23 LAB — LIPID PANEL
Cholesterol (LDL): 96 mg/dL (ref <130)
Cholesterol/HDL Ratio: 3.8
HDL Cholesterol: 43 mg/dL (ref >40)
Non-HDL Cholesterol: 121 mg/dL (ref 0–159)
Total Cholesterol: 164 mg/dL (ref <200)
Triglyceride: 125 mg/dL (ref <150)

## 2012-09-27 ENCOUNTER — Ambulatory Visit (HOSPITAL_BASED_OUTPATIENT_CLINIC_OR_DEPARTMENT_OTHER): Payer: No Typology Code available for payment source | Admitting: Pharmacist

## 2012-09-27 DIAGNOSIS — I513 Intracardiac thrombosis, not elsewhere classified: Secondary | ICD-10-CM

## 2012-09-27 DIAGNOSIS — Z7901 Long term (current) use of anticoagulants: Secondary | ICD-10-CM

## 2012-09-27 DIAGNOSIS — Z5181 Encounter for therapeutic drug level monitoring: Secondary | ICD-10-CM

## 2012-09-27 DIAGNOSIS — I4891 Unspecified atrial fibrillation: Secondary | ICD-10-CM

## 2012-09-27 NOTE — Telephone Encounter (Signed)
ID  Visit type: Appointed  Interpreter present: None  Referral type: Written by Dr. Marye Round on 08/19/2012 for anticoagulation.  PCP: Dr. Carlynn Herald, [facility], [phone]    Other Managing Provider: Dr. Marye Round, [facility] Cardiology    Pt Contact Info: [phone]    Anticoagulation Information  Goal INR: 2-3  Indication: A-Fib (CHADS2 = 1, CHF)  Duration: Chronic;  At least 3 consecutive weekly therapeutic INRs prior to cardioversion, then at least 4 weekly INRs thereafter; then every 4-8 weeks if stable    Warfarin  Warfarin tablet size: 5mg **GENERIC WARFARIN**filled at [facility] in [facility]  Weekly dose as of last visit: Initiation--45mg   Dose as of last visit: 5mg  Tue,Thurs,Sat. and 7.5mg  all other days of the week.    SUBJECTIVE  Mark Poole is a 62 year old male here today to see the pharmacist for anticoagulation follow-up. Reports taking warfarin as above, denies any missed doses. Takes in the evening.  Denies medication/OTC changes. Per Cardiology note 09/16/12, patient will stay on warfarin for 3 months and then re-address need for cardioversion.    LIFESTYLE  -Diet: Denies change in diet  -Substance Use: Denies current alcohol and tobacco use (quit ETOH 03/2012, quit smoking ~2004)  -Exercise: No changes in activity level  -Other: works as a Music therapist    ALLERGIES  Review of patient's allergies indicates:  No known allergies    MEDICATIONS (patient carries a list with him)  --metoprolol succinate 100 mg take 2 tablets BID  --lisinopril 10 mg take 2 tablets once daily  --furosemide 40 mg take 1 tablet BID  --digoxin 0.125 take 1/2 tablet daily  --warfarin (was switched to aspirin 325 mg x 1-2 months)    OTCs:  Fish Oil  Magnesium  Potassium?    Medications reviewed on:  09/08/2012     REVIEW OF SYSTEMS  CONSTITUTIONAL: Denies symptoms  -ANTICOAGULATION:        -BLEEDING SYMPTOMS: Denies symptoms       -THROMBOEMBOLISM SYMPTOMS            -STROKE: Denies symptoms           RESULTS REVIEW    Prothrombin INR   Date Value Range Status   09/23/2012 2.7* 0.8 - 1.3 Final   09/16/2012 4.4* 0.8 - 1.3 Final   09/08/2012 4.5* 0.8 - 1.3 Final         ASSESSMENT/PLAN  Therapeutic INR without complications  1. Continue current dose  2. Recheck INR 10/11/12      Anticoagulation Dose Instructions as of 09/27/2012      Total Sun Mon Tue Wed Thu Fri Sat    Weekly Dose 42.5 mg 5 mg 5 mg 7.5 mg 5 mg 7.5 mg 5 mg 7.5 mg          EDUCATION  -Primary learner(s): Patient  -Patient challenges to learning: None  -Challenges impacting this teaching session: None  -Education materials provided: None  -Post education response: states understanding    Topics Discussed:    Warfarin  Warfarin discussion topics included: -Current dose of warfarin and regimen  -When/where/with whom follow up will be provided    Warfarin information was taught by: providing verbal information      TIME SPENT  -Total time spent teaching: 2 minutes  -Total visit time: 5 minutes

## 2012-10-07 ENCOUNTER — Other Ambulatory Visit: Payer: Self-pay | Admitting: Pharmacist

## 2012-10-07 DIAGNOSIS — I4891 Unspecified atrial fibrillation: Secondary | ICD-10-CM

## 2012-10-07 MED ORDER — WARFARIN SODIUM 5 MG OR TABS
ORAL_TABLET | ORAL | Status: DC
Start: 2012-10-07 — End: 2012-10-15

## 2012-10-07 NOTE — Telephone Encounter (Signed)
Anticoagulation Dose Instructions as of 09/27/2012      Total  Sun  Mon  Tue  Wed  Thu  Fri  Sat     Weekly Dose  42.5 mg  5 mg  5 mg  7.5 mg  5 mg  7.5 mg  5 mg  7.5 mg

## 2012-10-11 ENCOUNTER — Encounter (HOSPITAL_BASED_OUTPATIENT_CLINIC_OR_DEPARTMENT_OTHER): Payer: Self-pay

## 2012-10-15 ENCOUNTER — Ambulatory Visit (HOSPITAL_BASED_OUTPATIENT_CLINIC_OR_DEPARTMENT_OTHER): Payer: No Typology Code available for payment source | Attending: Internal Medicine

## 2012-10-15 DIAGNOSIS — I5189 Other ill-defined heart diseases: Secondary | ICD-10-CM | POA: Insufficient documentation

## 2012-10-15 DIAGNOSIS — I4891 Unspecified atrial fibrillation: Secondary | ICD-10-CM | POA: Insufficient documentation

## 2012-10-15 DIAGNOSIS — Z5181 Encounter for therapeutic drug level monitoring: Secondary | ICD-10-CM | POA: Insufficient documentation

## 2012-10-15 DIAGNOSIS — Z7901 Long term (current) use of anticoagulants: Secondary | ICD-10-CM | POA: Insufficient documentation

## 2012-10-15 DIAGNOSIS — I513 Intracardiac thrombosis, not elsewhere classified: Secondary | ICD-10-CM

## 2012-10-15 LAB — PROTHROMBIN TIME
Prothrombin INR: 3.8 — ABNORMAL HIGH (ref 0.8–1.3)
Prothrombin Time Patient: 37.1 s — ABNORMAL HIGH (ref 10.7–15.6)

## 2012-10-15 MED ORDER — WARFARIN SODIUM 5 MG OR TABS
ORAL_TABLET | ORAL | Status: DC
Start: 2012-10-15 — End: 2012-11-08

## 2012-10-15 NOTE — Telephone Encounter (Signed)
ID  Visit type:telephone f/u  Interpreter present: None  Referral type: Written by Dr. Marye Round on 08/19/2012 for anticoagulation.  PCP: Dr. Carlynn Herald, [facility], [phone]    Other Managing Provider: Dr. Marye Round, [facility]    Pt Contact Info: [phone]    Anticoagulation Information  Goal INR: 2-3  Indication: A-Fib (CHADS2 = 1, CHF)  Duration: Chronic;  At least 3 consecutive weekly therapeutic INRs prior to cardioversion, then at least 4 weekly INRs thereafter; then every 4-8 weeks if stable    Warfarin  Warfarin tablet size: 5mg **GENERIC WARFARIN**filled at [facility] in [facility]  Weekly dose as of last visit: Initiation--45mg   Dose as of last visit: 5mg  Tue,Thurs,Sat. and 7.5mg  all other days of the week.    SUBJECTIVE  Mark Poole is a 62 year old male followed by pharmacist for anticoagulation.  A telephone call was made to pt for f/u today.  He reports taking warfarin as above, denies any missed doses. Takes in the evening. Reports that he is using salt substitute rather than table salt.   Denies other dietary changes, denies changes in dietary Vit K intake.   Denies medication/OTC changes.Takes his Fish oil supplement regularly and has not changed the dose of this in the recent past.       Per Cardiology note 09/16/12, patient will stay on warfarin for 3 months and then re-address need for cardioversion.    LIFESTYLE  -Diet: Denies change in diet.  As above, now using salt substitute  -Substance Use: Denies current alcohol and tobacco use (quit ETOH 03/2012, quit smoking ~2004)  -Exercise: No changes in activity level  -Other: works as a Music therapist    ALLERGIES  Review of patient's allergies indicates:  No known allergies    MEDICATIONS (patient carries a list with him)  --metoprolol succinate 100 mg take 2 tablets BID  --lisinopril 10 mg take 2 tablets once daily  --furosemide 40 mg take 1 tablet BID  --digoxin 0.125 take 1/2 tablet  daily  --warfarin (was switched to aspirin 325 mg x 1-2 months)    OTCs:  Fish Oil  Magnesium  Potassium?    Medications reviewed on:  09/08/2012     REVIEW OF SYSTEMS  CONSTITUTIONAL: Denies symptoms  -ANTICOAGULATION:        -BLEEDING SYMPTOMS: Denies symptoms       -THROMBOEMBOLISM SYMPTOMS            -STROKE: Denies symptoms          RESULTS REVIEW  INR      3.8   10/15/2012  INR      2.7   09/23/2012  INR      4.4   09/16/2012      ASSESSMENT/PLAN  Anticoagulation for atrial fibrillation.  Supratherapeutic INR without complications  1.Decrease warfarin dose to 7.5mg  twice per week and 5mg  daily the other five days of the week as below.  This dose decrease represents a 6% decrease in weekly dose  2. Recheck INR 10/25/12      Anticoagulation Dose Instructions as of 10/15/2012      Total Sun Mon Tue Wed Thu Fri Sat    Weekly Dose 40 mg 5 mg 5 mg 7.5 mg 5 mg 7.5 mg 5 mg 5 mg          EDUCATION  -Primary learner(s): Patient  -Patient challenges to learning: None  -Challenges impacting this teaching session: None  -Education materials provided: None  -  Post education response: states understanding    Topics Discussed:    Warfarin  Warfarin discussion topics included: -Current dose of warfarin and regimen  -When/where/with whom follow up will be provided  -drug-drug interactions    Warfarin information was taught by: providing verbal information      TIME SPENT  -Total time spent teaching: 2 minutes  -Total visit time: 5 minutes

## 2012-10-22 ENCOUNTER — Telehealth (HOSPITAL_BASED_OUTPATIENT_CLINIC_OR_DEPARTMENT_OTHER): Payer: Self-pay

## 2012-10-22 ENCOUNTER — Other Ambulatory Visit (HOSPITAL_BASED_OUTPATIENT_CLINIC_OR_DEPARTMENT_OTHER): Payer: Self-pay

## 2012-10-22 DIAGNOSIS — I4891 Unspecified atrial fibrillation: Secondary | ICD-10-CM

## 2012-10-22 DIAGNOSIS — Z5181 Encounter for therapeutic drug level monitoring: Secondary | ICD-10-CM

## 2012-10-22 DIAGNOSIS — Z7901 Long term (current) use of anticoagulants: Secondary | ICD-10-CM

## 2012-10-22 NOTE — Telephone Encounter (Signed)
Patient is apart of our tele medicine program and his heart rate has been up in the 140's . Called and patient reports he has either been doing something or not taken his medication prior to the test. Patient will now take medicine before he takes his BP.     WCTM

## 2012-10-25 ENCOUNTER — Ambulatory Visit (HOSPITAL_BASED_OUTPATIENT_CLINIC_OR_DEPARTMENT_OTHER): Payer: No Typology Code available for payment source | Attending: Internal Medicine | Admitting: Pharmacist

## 2012-10-25 DIAGNOSIS — Z7901 Long term (current) use of anticoagulants: Secondary | ICD-10-CM | POA: Insufficient documentation

## 2012-10-25 DIAGNOSIS — Z5181 Encounter for therapeutic drug level monitoring: Secondary | ICD-10-CM | POA: Insufficient documentation

## 2012-10-25 DIAGNOSIS — I4891 Unspecified atrial fibrillation: Secondary | ICD-10-CM | POA: Insufficient documentation

## 2012-10-25 DIAGNOSIS — I513 Intracardiac thrombosis, not elsewhere classified: Secondary | ICD-10-CM

## 2012-10-25 LAB — PROTHROMBIN TIME
Prothrombin INR: 3.3 — ABNORMAL HIGH (ref 0.8–1.3)
Prothrombin Time Patient: 33.3 s — ABNORMAL HIGH (ref 10.7–15.6)

## 2012-10-25 NOTE — Telephone Encounter (Signed)
ID  Visit type:telephone f/u  Interpreter present: None  Referral type: Written by Dr. Marye Round on 08/19/2012 for anticoagulation.  PCP: Dr. Carlynn Herald, [facility], [phone]    Other Managing Provider: Dr. Marye Round, [facility] Cardiology    Anticoagulation Information  Goal INR: 2-3  Indication: A-Fib (CHADS2 = 1, CHF)  Duration: Chronic;  At least 3 consecutive weekly therapeutic INRs prior to cardioversion, then at least 4 weekly INRs thereafter; then every 4-8 weeks if stable    Warfarin  Warfarin tablet size: 5mg **GENERIC WARFARIN**filled at [facility] in [facility]  Weekly dose as of last visit: Decreased 10/15/12 to 40mg /week (from 42.5mg /week, from 45mg /week)  Dose as of last visit: 5mg  Tue/Thurs and 7.5mg  all other days of the week.    SUBJECTIVE  Mark Poole is a 62 year old male on anticoagulation for AFib.  Contacted patient by phone for INR results and follow-up today.  He reports taking his decreased warfarin as above, denies any missed doses. Takes in the evening. Denies medication/OTC changes.Takes his Fish oil supplement regularly and has not changed the dose of this in the recent past.      Per Cardiology note 09/16/12, patient will stay on warfarin for 3 months and then re-address need for cardioversion.      LIFESTYLE  -Diet: Reports that he is using salt substitute rather than table salt.   Denies other dietary changes, denies changes in dietary Vit K intake.   -Substance Use: Denies current alcohol and tobacco use (quit ETOH 03/2012, quit smoking ~2004)  -Exercise: No changes in activity level  -Other: works as a Music therapist    REVIEW OF SYSTEMS  -ANTICOAGULATION:        -BLEEDING SYMPTOMS: Denies symptoms       -THROMBOEMBOLISM SYMPTOMS            -STROKE: Denies symptoms          RESULTS REVIEW  INR      3.3   10/25/2012  INR      3.8   10/15/2012  INR      2.7   09/23/2012    ASSESSMENT/PLAN  Anticoagulation for atrial fibrillation:  Supratherapeutic INR  without complications, but now trending downward closer to goal range after dose decrease.  Will reduce dose further and continue to monitor.    1. Decrease Warfarin dose to 7.5mg  on Thursdays and 5mg  daily all other days of the week as below.  This dose decrease represents another 6% decrease in weekly dose.  2. Recheck INR 11/08/12.    Anticoagulation Dose Instructions as of 10/25/2012      Total Sun Mon Tue Wed Thu Fri Sat    Weekly Dose 37.5 mg 5 mg 5 mg 5 mg 5 mg 7.5 mg 5 mg 5 mg      Description         Dose reduction.      Topics Discussed:    Warfarin  Warfarin discussion topics included: -Current dose of warfarin and regimen  -When/where/with whom follow up will be provided  -drug-drug interactions    Warfarin information was taught by: providing verbal information    TIME SPENT  -Total time spent teaching: 2 minutes  -Total visit time: 5 minutes

## 2012-11-08 ENCOUNTER — Telehealth (HOSPITAL_BASED_OUTPATIENT_CLINIC_OR_DEPARTMENT_OTHER): Payer: Self-pay

## 2012-11-08 ENCOUNTER — Other Ambulatory Visit: Payer: Self-pay | Admitting: Internal Medicine

## 2012-11-08 ENCOUNTER — Other Ambulatory Visit (HOSPITAL_BASED_OUTPATIENT_CLINIC_OR_DEPARTMENT_OTHER): Payer: Self-pay | Admitting: Pharmacist

## 2012-11-08 DIAGNOSIS — I513 Intracardiac thrombosis, not elsewhere classified: Secondary | ICD-10-CM

## 2012-11-08 DIAGNOSIS — I4891 Unspecified atrial fibrillation: Secondary | ICD-10-CM

## 2012-11-08 MED ORDER — WARFARIN SODIUM 5 MG OR TABS
ORAL_TABLET | ORAL | Status: DC
Start: 2012-11-08 — End: 2013-01-18

## 2012-11-10 ENCOUNTER — Encounter (HOSPITAL_BASED_OUTPATIENT_CLINIC_OR_DEPARTMENT_OTHER): Payer: Self-pay

## 2012-11-10 ENCOUNTER — Other Ambulatory Visit: Payer: Self-pay | Admitting: Internal Medicine

## 2012-11-10 DIAGNOSIS — I509 Heart failure, unspecified: Secondary | ICD-10-CM

## 2012-11-10 MED ORDER — LISINOPRIL 20 MG OR TABS
20.0000 mg | ORAL_TABLET | Freq: Every day | ORAL | Status: DC
Start: 2012-11-10 — End: 2013-08-27

## 2012-11-10 NOTE — Telephone Encounter (Signed)
Last visit/BMP 09/16/12; to RTC .

## 2012-12-02 ENCOUNTER — Other Ambulatory Visit (HOSPITAL_BASED_OUTPATIENT_CLINIC_OR_DEPARTMENT_OTHER): Payer: Self-pay

## 2012-12-02 DIAGNOSIS — I4891 Unspecified atrial fibrillation: Secondary | ICD-10-CM

## 2012-12-02 DIAGNOSIS — Z7901 Long term (current) use of anticoagulants: Secondary | ICD-10-CM

## 2012-12-02 DIAGNOSIS — Z5181 Encounter for therapeutic drug level monitoring: Secondary | ICD-10-CM

## 2012-12-03 ENCOUNTER — Telehealth (HOSPITAL_BASED_OUTPATIENT_CLINIC_OR_DEPARTMENT_OTHER): Payer: Self-pay

## 2012-12-10 ENCOUNTER — Ambulatory Visit (HOSPITAL_BASED_OUTPATIENT_CLINIC_OR_DEPARTMENT_OTHER): Payer: No Typology Code available for payment source | Attending: Internal Medicine | Admitting: Pharmacist

## 2012-12-10 DIAGNOSIS — I513 Intracardiac thrombosis, not elsewhere classified: Secondary | ICD-10-CM

## 2012-12-10 DIAGNOSIS — I4891 Unspecified atrial fibrillation: Secondary | ICD-10-CM | POA: Insufficient documentation

## 2012-12-10 DIAGNOSIS — I5189 Other ill-defined heart diseases: Secondary | ICD-10-CM | POA: Insufficient documentation

## 2012-12-10 LAB — PROTHROMBIN TIME
Prothrombin INR: 3.5 — ABNORMAL HIGH (ref 0.8–1.3)
Prothrombin Time Patient: 34.3 s — ABNORMAL HIGH (ref 10.7–15.6)

## 2012-12-13 NOTE — Telephone Encounter (Signed)
ID  Visit type:telephone f/u  Interpreter present: None  Referral type: Written by Dr. Marye Round on 08/19/2012 for anticoagulation.  PCP: Dr. Carlynn Herald, [facility], [phone]    Other Managing Provider: Dr. Marye Round, [facility] Card[facility]    Anticoagulation Information  Goal INR: 2-3  Indication: A-Fib (CHADS2 = 1, CHF)  Duration: Chronic;     Warfarin  Warfarin tablet size: 5mg **GENERIC WARFARIN**filled at [facility] in [facility]  Weekly dose as of last visit: Decreased 10/15/12 to 40mg /week (from 42.5mg /week, from 45mg /week)  Dose as of last visit: 5mg  Tue/Thurs and 7.5mg  all other days of the week.    SUBJECTIVE  Mark Poole is a 62 year old male on anticoagulation for AFib.  Contacted patient by phone for INR results and follow-up today.  He reports taking his decreased warfarin as above. He had been sick with a cold this past week and has been taking Nyquil, cold preps with acetaminophen. Also, drank more alcohol than usual the night before blood test. Takes in the evening. Takes  Fish oil supplement regularly and has not changed the dose of this in the recent past.      Per Cardiology note 09/16/12, patient will stay on warfarin for 3 months and then re-address need for cardioversion.      LIFESTYLE  -Diet: Reports that he is using salt substitute rather than table salt.   Denies other dietary changes, denies changes in dietary Vit K intake.   -Substance Use: Denies current alcohol and tobacco use (quit ETOH 03/2012, quit smoking ~2004)  -Exercise: No changes in activity level  -Other: works as a Music therapist    REVIEW OF SYSTEMS  -ANTICOAGULATION:        -BLEEDING SYMPTOMS: Denies symptoms       -THROMBOEMBOLISM SYMPTOMS            -STROKE: Denies symptoms          RESULTS REVIEW  INR      3.5   12/10/2012  INR      3.3   10/25/2012  INR      3.8   10/15/2012    ASSESSMENT/PLAN  Anticoagulation for atrial fibrillation:    Supratherapeutic INR without complications- due to  alcohol and possibly acetaminophen  1. Hold 1 dose, then continue Warfarin dose to 7.5mg  on Thursdays and 5mg  daily all other days of the week as below.    2. Recheck INR 12/24/12   Anticoagulation Dose Instructions as of 12/10/2012      Total Sun Mon Tue Wed Thu Fri Sat    Weekly Dose 32.5 mg 5 mg 5 mg 5 mg 5 mg 7.5 mg Hold 5 mg      Description         Hold x1, then resume 7.5mg  Thur and 5mg  all other days      Topics Discussed:    Warfarin  Warfarin discussion topics included: -Current dose of warfarin and regimen  -When/where/with whom follow up will be provided  -drug-drug interactions    Warfarin information was taught by: providing verbal information    TIME SPENT  -Total time spent teaching:7 minutes  -Total visit time: 10 minutes

## 2012-12-17 ENCOUNTER — Telehealth (HOSPITAL_BASED_OUTPATIENT_CLINIC_OR_DEPARTMENT_OTHER): Payer: Self-pay

## 2012-12-24 ENCOUNTER — Other Ambulatory Visit (HOSPITAL_BASED_OUTPATIENT_CLINIC_OR_DEPARTMENT_OTHER): Payer: Self-pay | Admitting: Pharmacist

## 2012-12-24 ENCOUNTER — Ambulatory Visit (HOSPITAL_BASED_OUTPATIENT_CLINIC_OR_DEPARTMENT_OTHER): Payer: No Typology Code available for payment source | Attending: Internal Medicine | Admitting: Pharmacist

## 2012-12-24 DIAGNOSIS — I4891 Unspecified atrial fibrillation: Secondary | ICD-10-CM

## 2012-12-24 DIAGNOSIS — Z7901 Long term (current) use of anticoagulants: Secondary | ICD-10-CM | POA: Insufficient documentation

## 2012-12-24 DIAGNOSIS — I513 Intracardiac thrombosis, not elsewhere classified: Secondary | ICD-10-CM

## 2012-12-24 DIAGNOSIS — Z5181 Encounter for therapeutic drug level monitoring: Secondary | ICD-10-CM | POA: Insufficient documentation

## 2012-12-24 LAB — PROTHROMBIN TIME
Prothrombin INR: 4 — ABNORMAL HIGH (ref 0.8–1.3)
Prothrombin Time Patient: 38.2 — ABNORMAL HIGH (ref 10.7–15.6)

## 2012-12-24 NOTE — Telephone Encounter (Signed)
ID  Visit type:telephone f/u  Interpreter present: None  Referral type: Written by Dr. Marye Round on 08/19/2012 for anticoagulation.  PCP: Dr. Carlynn Herald, [facility], [phone]    Other Managing Provider: Dr. Marye Round, [facility] Cardiology    Anticoagulation Information  Goal INR: 2-3  Indication: A-Fib (CHADS2 = 1, CHF)  Duration: Chronic    Warfarin  Warfarin tablet size: 5mg **GENERIC WARFARIN**filled at [facility] in [facility]  Weekly dose as of last visit: 37.5mg /week   Dose as of last visit: 7.5mg  Thursday and 5mg  all other days of the week.    SUBJECTIVE  Mark Poole is a 62 year old male on anticoagulation for AFib.  Contacted patient by phone for INR results and follow-up today.  He reports holding one dose of warfarin as instructed last time and then resuming his warfarin as above.  Denies missed or extra doses.  No longer sick.  Also, drinks about 2 drinks every other evening; did not consume more last night. Not interested in decreasing ETOH consumption at this point.  Takes warfarin in the evening. Takes  Fish oil supplement regularly and has not changed the dose of this in the recent past.      Per Cardiology note 09/16/12, patient will stay on warfarin for 3 months and then re-address need for cardioversion.  Cardiology appt on 01/20/13.    LIFESTYLE  -Diet: Reports that he is using salt substitute rather than table salt.   Denies other dietary changes except eating more red meat versus chicken, denies changes in dietary Vit K intake.   -Substance Use: Drinks alcohol about 2 drinks (hard liq) every other day and denies tobacco use (previously quit ETOH 03/2012, quit smoking ~2004)  -Exercise: Slightly increased - working more  -Other: works as a Music therapist    REVIEW OF SYSTEMS  -ANTICOAGULATION:        -BLEEDING SYMPTOMS: Denies symptoms       -THROMBOEMBOLISM SYMPTOMS            -STROKE: Denies symptoms          RESULTS REVIEW  INR      4.0   12/24/2012  INR       3.5   12/10/2012  INR      3.3   10/25/2012      ASSESSMENT  INR Supratherapeutic without complications.  No transient factors except patient has been consistently consuming 2 drinks every other day since he last spoke with [facility]ticoag Clinic on 12/10/12.  Unclear if this is causing increase in INR, however, his INR has been trending up since 12/10/12.      PLAN   1. Hold Warfarin 5mg  dose x 1 tonight and then DECREASE dose to 5mg  po qday.   2. Re-check INR on 01/07/13.   3. Instructed patient to call in next two weeks in ETOH consumption changes or experiences any s/sx of bruising or bleeding.        Anticoagulation Dose Instructions as of 12/24/2012      Total Sun Mon Tue Wed Thu Fri Sat    Weekly Dose 30 mg 5 mg 5 mg 5 mg 5 mg 5 mg Hold 5 mg      Description         Hold x1, then reduce dose to 5mg  all other days        TIME SPENT  -Total time spent teaching: 3 minutes  -Total visit time: 7 minutes

## 2013-01-07 ENCOUNTER — Telehealth (HOSPITAL_BASED_OUTPATIENT_CLINIC_OR_DEPARTMENT_OTHER): Payer: No Typology Code available for payment source

## 2013-01-07 ENCOUNTER — Other Ambulatory Visit (HOSPITAL_BASED_OUTPATIENT_CLINIC_OR_DEPARTMENT_OTHER): Payer: Self-pay | Admitting: Pharmacist

## 2013-01-07 DIAGNOSIS — Z5181 Encounter for therapeutic drug level monitoring: Secondary | ICD-10-CM

## 2013-01-07 DIAGNOSIS — I513 Intracardiac thrombosis, not elsewhere classified: Secondary | ICD-10-CM

## 2013-01-07 DIAGNOSIS — I4891 Unspecified atrial fibrillation: Secondary | ICD-10-CM

## 2013-01-07 DIAGNOSIS — Z7901 Long term (current) use of anticoagulants: Secondary | ICD-10-CM

## 2013-01-10 ENCOUNTER — Other Ambulatory Visit: Payer: Self-pay

## 2013-01-12 ENCOUNTER — Encounter (HOSPITAL_BASED_OUTPATIENT_CLINIC_OR_DEPARTMENT_OTHER): Payer: Self-pay

## 2013-01-17 ENCOUNTER — Telehealth (HOSPITAL_BASED_OUTPATIENT_CLINIC_OR_DEPARTMENT_OTHER): Payer: Self-pay

## 2013-01-17 ENCOUNTER — Ambulatory Visit (HOSPITAL_BASED_OUTPATIENT_CLINIC_OR_DEPARTMENT_OTHER): Payer: No Typology Code available for payment source | Attending: Internal Medicine | Admitting: Pharmacist

## 2013-01-17 DIAGNOSIS — I4891 Unspecified atrial fibrillation: Secondary | ICD-10-CM

## 2013-01-17 DIAGNOSIS — I513 Intracardiac thrombosis, not elsewhere classified: Secondary | ICD-10-CM

## 2013-01-17 DIAGNOSIS — I5189 Other ill-defined heart diseases: Secondary | ICD-10-CM | POA: Insufficient documentation

## 2013-01-17 LAB — PROTHROMBIN TIME
Prothrombin INR: 2.2 — ABNORMAL HIGH (ref 0.8–1.3)
Prothrombin Time Patient: 24.5 s — ABNORMAL HIGH (ref 10.7–15.6)

## 2013-01-17 NOTE — Telephone Encounter (Signed)
Pt called to resched 10/17 phone appt to 10/27

## 2013-01-17 NOTE — Telephone Encounter (Signed)
Called and left vmail to reschedule missed 10/17 INR

## 2013-01-18 MED ORDER — WARFARIN SODIUM 5 MG OR TABS
ORAL_TABLET | ORAL | Status: DC
Start: 2013-01-18 — End: 2013-01-20

## 2013-01-18 NOTE — Telephone Encounter (Signed)
ID  Visit type:telephone f/u  Interpreter present: None  Referral type: Written by Dr. Marye Round on 08/19/2012 for anticoagulation.  PCP: Dr. Carlynn Herald, [facility], [phone]  Other Managing Provider: Dr. Marye Round, [facility]    Anticoagulation Information  Goal INR: 2-3  Indication: A-Fib (CHADS2 = 1, CHF)  Duration: Chronic    Warfarin  Warfarin tablet size: 5mg **GENERIC WARFARIN**filled at [facility] in [facility]  Weekly dose as of last visit: Decreased 12/24/12 to 35mg /week (from 37.5mg /week)   Dose as of last visit: 5mg  daily    SUBJECTIVE  Mark Poole is a 62 year old male on anticoagulation for AFib.  Contacted patient by phone for INR results and follow-up today.  He reports holding one dose of warfarin as instructed last time and then decreasing his Warfarin dose to 5mg  daily as listed above.  Denies missed or extra doses.  No longer sick.  Also, drinks about 2 drinks every other evening.  Per Cardiology note 09/16/12, patient will stay on warfarin for 3 months and then re-address need for cardioversion.  Cardiology appt on 01/20/13.    LIFESTYLE  -Diet: Reports that he is using salt substitute rather than table salt.   Denies other dietary changes except eating more red meat versus chicken, denies changes in dietary Vit K intake.   -Substance Use: Drinks alcohol about 2 drinks (hard liq) every other day and denies tobacco use (previously quit ETOH 03/2012, quit smoking ~2004)  -Exercise: Slightly increased - working more  -Other: works as a Music therapist    REVIEW OF SYSTEMS  -ANTICOAGULATION:        -BLEEDING SYMPTOMS: Denies symptoms       -THROMBOEMBOLISM SYMPTOMS            -STROKE: Denies symptoms          RESULTS REVIEW  INR      2.2   01/17/2013  INR      4.0   12/24/2012  INR      3.5   12/10/2012    ASSESSMENT  INR therapeutic without complications.      PLAN   1. Continue Warfarin 5mg  daily.   2. Re-check INR on 02/15/13.   3. Instructed patient to call  us if there are any changes at his Cardiology visit on 01/20/13, or if he gets scheduled for a cardioversion as we would need to set up weekly INRs prior to and after his cardioversion.  Patient verbalized understanding.    Anticoagulation Dose Instructions as of 01/17/2013      Total Sun Mon Tue Wed Thu Fri Sat    Weekly Dose 35 mg 5 mg 5 mg 5 mg 5 mg 5 mg 5 mg 5 mg        TIME SPENT  -Total time spent teaching: 3 minutes  -Total visit time: 7 minutes

## 2013-01-20 ENCOUNTER — Encounter (HOSPITAL_BASED_OUTPATIENT_CLINIC_OR_DEPARTMENT_OTHER): Payer: Self-pay | Admitting: Internal Medicine

## 2013-01-20 ENCOUNTER — Ambulatory Visit (HOSPITAL_BASED_OUTPATIENT_CLINIC_OR_DEPARTMENT_OTHER): Payer: No Typology Code available for payment source | Attending: Internal Medicine | Admitting: Internal Medicine

## 2013-01-20 VITALS — BP 126/88 | HR 90 | Temp 97.7°F | Resp 18 | Ht 71.0 in | Wt 186.0 lb

## 2013-01-20 DIAGNOSIS — I4891 Unspecified atrial fibrillation: Secondary | ICD-10-CM | POA: Insufficient documentation

## 2013-01-20 DIAGNOSIS — I5189 Other ill-defined heart diseases: Secondary | ICD-10-CM | POA: Insufficient documentation

## 2013-01-20 DIAGNOSIS — I509 Heart failure, unspecified: Secondary | ICD-10-CM | POA: Insufficient documentation

## 2013-01-20 DIAGNOSIS — Z7901 Long term (current) use of anticoagulants: Secondary | ICD-10-CM | POA: Insufficient documentation

## 2013-01-20 DIAGNOSIS — I5020 Unspecified systolic (congestive) heart failure: Secondary | ICD-10-CM

## 2013-01-20 DIAGNOSIS — I513 Intracardiac thrombosis, not elsewhere classified: Secondary | ICD-10-CM

## 2013-01-20 DIAGNOSIS — Z5181 Encounter for therapeutic drug level monitoring: Secondary | ICD-10-CM | POA: Insufficient documentation

## 2013-01-20 LAB — BASIC METABOLIC PANEL
Anion Gap: 5 (ref 3–11)
Calcium: 9.7 mg/dL (ref 8.9–10.2)
Carbon Dioxide, Total: 31 mEq/L (ref 22–32)
Chloride: 102 mEq/L (ref 98–108)
Creatinine: 1.34 mg/dL — ABNORMAL HIGH (ref 0.51–1.18)
GFR, Calc, African American: >60 mL/min (ref >59)
GFR, Calc, European American: 54 mL/min — ABNORMAL LOW (ref >59)
Glucose: 91 mg/dL (ref 62–125)
Potassium: 4.3 mEq/L (ref 3.7–5.2)
Sodium: 138 mEq/L (ref 136–145)
Urea Nitrogen: 26 mg/dL — ABNORMAL HIGH (ref 8–21)

## 2013-01-20 LAB — PROTHROMBIN TIME
Prothrombin INR: 2.5 — ABNORMAL HIGH (ref 0.8–1.3)
Prothrombin Time Patient: 26.5 s — ABNORMAL HIGH (ref 10.7–15.6)

## 2013-01-20 LAB — B_TYPE NATRIURETIC PEPTIDE: B_Type Natriuretic Peptide: 569 pg/mL — ABNORMAL HIGH (ref <101)

## 2013-01-20 MED ORDER — WARFARIN SODIUM 5 MG OR TABS
ORAL_TABLET | ORAL | Status: DC
Start: 2013-01-20 — End: 2013-05-26

## 2013-01-20 NOTE — Progress Notes (Signed)
OUTPATIENT VISIT    ID/CHIEF COMPLAINT: Mark Poole is a 62 year old male here for CHF      HISTORY: 62 y/o male returns for f/u of his Afib, LAA thrombus seen on 08/30/12, and HFrEF.  He has been feeling well overall, c/o only fatigue in the afternoons. With increased salt in a meal he feels relatively acutely SOB, takes an extra lasix, and symptoms always resolve. This occurs 1-2x/month. He is working part-time. No falls and little to no ladder climbing. Adherent with meds. INR therapeutic to supratherapeutic, managed by ACC. No CP, palpitations, change in chronic mild left leg LE edema, SOB, DOE, PND, orthopnea, presyncope, syncope. Walks 2-3x/week in park with dog for exercise without limitation.    REVIEW OF SYSTEMS:  A complete ROS was performed and is negative, except where noted in the above HPI.    PHYSICAL EXAM:  VITALS - Blood pressure 126/88, pulse 90, temperature 97.7 F (36.5 C), temperature source Temporal, resp. rate 18, height 5\' 11"  (1.803 m), weight 186 lb (84.369 kg), SpO2 98.00%.  GENERAL -  Well-appearing NAD  HEENT - anicteric, MMM  RESPIRATORY - CTAB  CARDIOVASCULAR - irreg irreg, no MRG, nl S1S2, no JVD, 1+ left LE edema, 2+ carotid and radial pulses, no heave or tthrill, nl PMI  ABDOMEN - soft NT  SKIN - warm dry pink  NEURO - MAE=, face symmetric  MENTAL STATUS - AAOx3    DIAGNOSTIC STUDIES:  08/30/12 TEE showed LAA thrombus  C7 and BMP pending today    ASSESSMENT/PLAN:  Mark Poole was seen today for HFrEF and afib. He remains in chronic atrial fibrillation, now well-anticoagulated continuously for 4 months since we last saw LAA thrombus on TEE. He has mild fatigue but no other signs or symptoms of HF and no symptoms of RVR. He is on adequate medical therapy for HF and is well-rate controlled for afib.    We discussed at length the options of rate vs rhythm control strategies and the role of DCCV in afib. We agreed to proceed with a repeat TEE and, if no thrombus seen, DCCV. We would  re-evaluate the goal of a rhythm strategy based on results, since his symptom of fatigue is mild and etiology is unclear. However both his afib and HF could be possible causes. I also explained that I would likely keep him on warfarin even if he returned to sinus rhythm, given low EF.    We will check BNP and BMP to f/u on HF.    Diagnoses and associated orders for this visit:    Atrial fibrillation  - Refilled Warfarin Sodium 5 MG Oral Tab; Take 1 tablet (5mg ) by mouth every day, or take as directed by [facility].    Left atrial thrombus  - REFERRAL TO ECHOCARDIOGRAM - TEE w/ DCCV on next day I am echo attending    HFrEF (heart failure with reduced ejection fraction)  - BASIC METABOLIC PANEL  - B_TYPE NATRIURETIC PEPTIDE        Return in about 3 months (around 04/22/2013) for f/u on afib and HFrEF., but plan for DCCV sooner and will re-assess f/u at that time.        ------------------------------------------------------------------------------------------------------------------  SUPPLEMENTAL INFO:    PROBLEM LIST:  Patient Active Problem List    Diagnosis Date Noted   . 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 HFrEF (heart failure with reduced ejection fraction) 09/03/2012     07/08/12 TTE: EF 36%.Left ventricle  is normal in size and thickness. Moderate global hypokinesis. Right ventricle is normal in size with moderate systolic dysfunction. Mild-moderate tricuspid regurgitation: estimated pulmonary artery systolic pressures are normal (29-34 mmHg).  Moderate mitral regurgitation.Trileaflet aortic valve with mild valvular stenosis and trace regurgitation.No pericardial effusion       . Left atrial thrombus 08/30/2012     08/30/12. TEE. Small thrombus in the left atrial appendage. A bright immobile structure in the appendage is again visualized, possibly representing a pectinate muscle vs calcified thrombus. Some views show an aspect of this mass that appears consistent with mobile thrombus. There is  spontaneous echo contrast in the appendage and left atrium. Cardioversion was thus deferred.          . Atrial fibrillation 08/19/2012        SOCIAL HISTORY:  History     Social History   . Marital Status: Single     Spouse Name: N/A     Number of Children: N/A   . Years of Education: N/A     Occupational History   . Not on file.     Social History Main Topics   . Smoking status: Former Games developer   . Smokeless tobacco: Not on file   . Alcohol Use: No   . Drug Use: No   . Sexually Active: Not on file     Other Topics Concern   . Not on file     Social History Narrative   . No narrative on file       CURRENT MEDICATIONS:  as of end of visit on 01/20/2013    Current Outpatient Prescriptions   Medication Sig   . Digoxin (DIGOX) 125 MCG Oral Tab 1 TABLET DAILY   . Furosemide 40 MG Oral Tab 1 TABLET DAILY, BID   . Lisinopril 20 MG Oral Tab Take 1 tablet (20 mg) by mouth daily.   . Metoprolol Succinate ER 100 MG Oral TABLET SR 24 HR 1 tablet, BID   . Warfarin Sodium 5 MG Oral Tab Take 1 tablet (5mg ) by mouth every day, or take as directed by [facility].     No current facility-administered medications for this visit.       ALLERGIES:  Review of patient's allergies indicates:  No Known Allergies     VITAL SIGN TRENDS:       Wt Readings from Last 5 Encounters:   01/20/13 186 lb (84.369 kg)   09/16/12 179 lb 12.8 oz (81.557 kg)   09/10/12 180 lb 8.9 oz (81.9 kg)   09/02/12 183 lb 6.8 oz (83.2 kg)          BP Readings from Last 5 Encounters:   01/20/13 126/88   09/16/12 111/81   09/10/12 89/62   09/02/12 98/78        STUDIES:  Orders Only on 12/24/2012   Component Date Value Range Status   . Prothrombin Time Patient 01/17/2013 24.5* 10.7 - 15.6 s Final   . Prothrombin INR 01/17/2013 2.2* 0.8 - 1.3 Final

## 2013-01-20 NOTE — Patient Instructions (Signed)
1. We will schedule a cardioversion procedure with a transesophageal echocardiogram  2. Continue current medications.  3. Visit with a Artist.  4. Establish care with a primary physician.

## 2013-01-25 ENCOUNTER — Other Ambulatory Visit (HOSPITAL_BASED_OUTPATIENT_CLINIC_OR_DEPARTMENT_OTHER): Payer: Medicare Other | Admitting: Internal Medicine

## 2013-01-25 DIAGNOSIS — I4891 Unspecified atrial fibrillation: Secondary | ICD-10-CM

## 2013-01-25 NOTE — Progress Notes (Signed)
The patient has been scheduled for cardioversion on 11/10.  Per Dr. Lucina Mellow [facility]) we will have the patient come to clinic that morning for an RN visit, ECG and to review lab results (M7, CBC, digoxin, INR and magnesium).

## 2013-01-31 ENCOUNTER — Ambulatory Visit (HOSPITAL_BASED_OUTPATIENT_CLINIC_OR_DEPARTMENT_OTHER): Payer: No Typology Code available for payment source

## 2013-01-31 ENCOUNTER — Other Ambulatory Visit (HOSPITAL_BASED_OUTPATIENT_CLINIC_OR_DEPARTMENT_OTHER): Payer: Self-pay | Admitting: Internal Medicine

## 2013-01-31 ENCOUNTER — Ambulatory Visit (HOSPITAL_BASED_OUTPATIENT_CLINIC_OR_DEPARTMENT_OTHER): Payer: No Typology Code available for payment source | Admitting: Internal Medicine

## 2013-01-31 ENCOUNTER — Other Ambulatory Visit (HOSPITAL_BASED_OUTPATIENT_CLINIC_OR_DEPARTMENT_OTHER): Payer: No Typology Code available for payment source

## 2013-01-31 ENCOUNTER — Ambulatory Visit (HOSPITAL_BASED_OUTPATIENT_CLINIC_OR_DEPARTMENT_OTHER)
Admission: RE | Admit: 2013-01-31 | Discharge: 2013-01-31 | Disposition: A | Discharge status: Home / Self Care | Payer: No Typology Code available for payment source | Source: Ambulatory Visit | Attending: Internal Medicine | Admitting: Internal Medicine

## 2013-01-31 DIAGNOSIS — I4891 Unspecified atrial fibrillation: Secondary | ICD-10-CM | POA: Insufficient documentation

## 2013-01-31 DIAGNOSIS — I5020 Unspecified systolic (congestive) heart failure: Secondary | ICD-10-CM

## 2013-01-31 DIAGNOSIS — Z7901 Long term (current) use of anticoagulants: Secondary | ICD-10-CM

## 2013-01-31 LAB — BASIC METABOLIC PANEL
Anion Gap: 4 (ref 3–11)
Calcium: 9.5 mg/dL (ref 8.9–10.2)
Carbon Dioxide, Total: 34 mEq/L — ABNORMAL HIGH (ref 22–32)
Chloride: 102 mEq/L (ref 98–108)
Creatinine: 1.52 mg/dL — ABNORMAL HIGH (ref 0.51–1.18)
GFR, Calc, African American: 56 mL/min — ABNORMAL LOW (ref >59)
GFR, Calc, European American: 46 mL/min — ABNORMAL LOW (ref >59)
Glucose: 128 mg/dL — ABNORMAL HIGH (ref 62–125)
Potassium: 4.7 mEq/L (ref 3.7–5.2)
Sodium: 140 mEq/L (ref 136–145)
Urea Nitrogen: 30 mg/dL — ABNORMAL HIGH (ref 8–21)

## 2013-01-31 LAB — DIGOXIN: Digoxin: 0.4 ng/mL — ABNORMAL LOW (ref 0.5–1.9)

## 2013-01-31 LAB — CBC (HEMOGRAM)
Hematocrit: 43 % (ref 38–50)
Hemoglobin: 14 g/dL (ref 13.0–18.0)
MCH: 32.3 pg (ref 27.3–33.6)
MCHC: 32.9 g/dL (ref 32.2–36.5)
MCV: 98 fL (ref 81–98)
Platelet Count: 184 10*3/uL (ref 150–400)
RBC: 4.33 mil/uL — ABNORMAL LOW (ref 4.40–5.60)
RDW-CV: 14.6 % — ABNORMAL HIGH (ref 11.6–14.4)
WBC: 5.81 10*3/uL (ref 4.30–10.00)

## 2013-01-31 LAB — PROTHROMBIN TIME
Prothrombin INR: 2.9 — ABNORMAL HIGH (ref 0.8–1.3)
Prothrombin Time Patient: 29.5 s — ABNORMAL HIGH (ref 10.7–15.6)

## 2013-01-31 LAB — MAGNESIUM: Magnesium: 2.3 mg/dL (ref 1.8–2.4)

## 2013-01-31 MED ORDER — METOPROLOL SUCCINATE ER 50 MG OR TB24
50.0000 mg | EXTENDED_RELEASE_TABLET | Freq: Every day | ORAL | Status: DC
Start: 2013-01-31 — End: 2013-03-03

## 2013-01-31 NOTE — Progress Notes (Signed)
Patient is here for a Nurse Visit on 01/31/2013 as ordered by Dr. Lucina Mellow. Patient is to have a TEE cardioversion today. He is here for INR check and EKG.    Patient reports he has not eaten anything except something to drink with his medications. He took lasix 80mg  this am due to some increased abdominal swelling. Patient denies CP, shortness of breath is stable, no dizziness, syncope or near syncope. Patient has no orthopnea, no PND and no BLE edema.     Irregular rhythm, rate controlled. No BLE edema. Lungs CTA.     EKG: patient in atrial fib, rate 61bpm    Patient Active Problem List   Diagnosis   . Atrial fibrillation   . Left atrial thrombus   . Alcohol abuse, in remission   . HFrEF (heart failure with reduced ejection fraction)         Vitals:  Filed Vitals:    01/31/13 0900   BP: 112/65   Pulse: 61   Resp: 16   Weight: 186 lb 1.1 oz (84.4 kg)   SpO2: 97%       Medications:  Current Outpatient Prescriptions   Medication Sig   . Digoxin (DIGOX) 125 MCG Oral Tab 1 TABLET DAILY   . Furosemide 40 MG Oral Tab 1 TABLET DAILY, BID   . Lisinopril 20 MG Oral Tab Take 1 tablet (20 mg) by mouth daily.   . Metoprolol Succinate ER 100 MG Oral TABLET SR 24 HR 1 tablet, BID   . Warfarin Sodium 5 MG Oral Tab Take 1 tablet (5mg ) by mouth every day, or take as directed by [facility].     No current facility-administered medications for this visit.       Lab Results:      Results for orders placed in visit on 01/31/13   BASIC METABOLIC PANEL       Result Value Range    Sodium 140  136 - 145 mEq/L    Potassium 4.7  3.7 - 5.2 mEq/L    Chloride 102  98 - 108 mEq/L    Carbon Dioxide, Total 34 (*) 22 - 32 mEq/L    Anion Gap 4  3 - 11    Glucose 128 (*) 62 - 125 mg/dL    Urea Nitrogen 30 (*) 8 - 21 mg/dL    Creatinine 0.98 (*) 0.51 - 1.18 mg/dL    Calcium 9.5  8.9 - 11.9 mg/dL    Gfr, Calc, European American 46 (*) >59 mL/min    GFR, Calc, African American 56 (*) >59 mL/min    GFR, Information        Value: Calculated  GFR in mL/min/1.73 m2 by MDRD equation.        Inaccurate with changing renal function.  See       [email or web address]               Results for orders placed in visit on 01/31/13   CBC (HEMOGRAM)       Result Value Range    WBC 5.81  4.30 - 10.00 THOU/uL    RBC 4.33 (*) 4.40 - 5.60 mil/uL    Hemoglobin 14.0  13.0 - 18.0 g/dL    Hematocrit 43  38 - 50 %    MCV 98  81 - 98 fL    MCH 32.3  27.3 - 33.6 pg    MCHC 32.9  32.2 - 36.5 g/dL    Platelet Count 147  150 -  400 THOU/uL    RDW-CV 14.6 (*) 11.6 - 14.4 %           Results for orders placed in visit on 01/31/13   PROTHROMBIN TIME       Result Value Range    Prothrombin Time Patient 29.5 (*) 10.7 - 15.6 s    Prothrombin INR 2.9 (*) 0.8 - 1.3       10/3  4.0  10/27 2.2  10/30 2.5  11/10 2.9      Plan:  Patient was escorted to [facility] at 09:45am for procedure at 11am.     Patient has a ride.

## 2013-02-02 ENCOUNTER — Encounter (HOSPITAL_BASED_OUTPATIENT_CLINIC_OR_DEPARTMENT_OTHER): Payer: Self-pay | Admitting: Internal Medicine

## 2013-02-02 DIAGNOSIS — Z7901 Long term (current) use of anticoagulants: Secondary | ICD-10-CM | POA: Insufficient documentation

## 2013-02-02 NOTE — Progress Notes (Signed)
HOSPITAL ADMISSION 01/31/13 to 01/31/13 - ANTICOAGULATION SUMMARY    BLOOD DRAW LOCATION: [facility]    CALL FOR RESULTS: Self  Phone: [phone]    ANTICOAGULATION TREATMENT PLAN    Indication: A Fib (CHADS2=1, CHF)  Goal INR: 2-3  Duration of Therapy: Lifelong    Hemorrhagic Risk Score: 0  Warfarin Tablet Size:     Relevant Historic Information:     Referring Provider: Dr. Lucina Mellow [facility] cardiology, Dr. Carlynn Herald [phone]    INTERVAL HISTORY    Mark Poole was last seen in [facility] on 01/17/13 .  His INR was 2.2 and he was instructed to continue Warfarin 5 mg daily.  Cardioversion was successful.      INR      2.9   01/31/2013  INR      2.5   01/20/2013  INR      2.2   01/17/2013    RELEVANT MEDICATION CHANGES  Decrease metoprolol from 100 mg to 50 mg daily.     PLAN:  1) Continue Warfarin 5 mg  2) Schedule anticoagulation follow-up the week of 02/07/13    Denver Faster, PharmD

## 2013-02-04 ENCOUNTER — Other Ambulatory Visit (HOSPITAL_BASED_OUTPATIENT_CLINIC_OR_DEPARTMENT_OTHER): Payer: Self-pay

## 2013-02-04 DIAGNOSIS — Z7901 Long term (current) use of anticoagulants: Secondary | ICD-10-CM

## 2013-02-04 DIAGNOSIS — Z5181 Encounter for therapeutic drug level monitoring: Secondary | ICD-10-CM

## 2013-02-04 DIAGNOSIS — I4891 Unspecified atrial fibrillation: Secondary | ICD-10-CM

## 2013-02-07 ENCOUNTER — Ambulatory Visit (HOSPITAL_BASED_OUTPATIENT_CLINIC_OR_DEPARTMENT_OTHER): Payer: No Typology Code available for payment source | Attending: Internal Medicine

## 2013-02-07 ENCOUNTER — Ambulatory Visit (HOSPITAL_BASED_OUTPATIENT_CLINIC_OR_DEPARTMENT_OTHER): Payer: No Typology Code available for payment source | Admitting: Pharmacist

## 2013-02-07 VITALS — BP 111/68 | HR 89 | Resp 16 | Wt 188.5 lb

## 2013-02-07 DIAGNOSIS — I4891 Unspecified atrial fibrillation: Secondary | ICD-10-CM | POA: Insufficient documentation

## 2013-02-07 DIAGNOSIS — Z7901 Long term (current) use of anticoagulants: Secondary | ICD-10-CM | POA: Insufficient documentation

## 2013-02-07 DIAGNOSIS — I482 Chronic atrial fibrillation, unspecified: Secondary | ICD-10-CM

## 2013-02-07 DIAGNOSIS — Z5181 Encounter for therapeutic drug level monitoring: Secondary | ICD-10-CM | POA: Insufficient documentation

## 2013-02-07 DIAGNOSIS — I513 Intracardiac thrombosis, not elsewhere classified: Secondary | ICD-10-CM

## 2013-02-07 LAB — PROTHROMBIN TIME
Prothrombin INR: 2.8 — ABNORMAL HIGH (ref 0.8–1.3)
Prothrombin Time Patient: 28.9 s — ABNORMAL HIGH (ref 10.7–15.6)

## 2013-02-08 NOTE — Telephone Encounter (Signed)
ID  Visit type:telephone f/u  Interpreter present: None  Referral type: Written by Dr. Marye Round on 08/19/2012 for anticoagulation.  PCP: Dr. Carlynn Herald, [facility], [phone]  Other Managing Provider: Dr. Marye Round, [facility] Cardiology    Anticoagulation Information  Goal INR: 2-3  Indication: A-Fib (CHADS2 = 1, CHF)  Duration: Chronic    Warfarin  Warfarin tablet size: 5mg **GENERIC WARFARIN**filled at [facility] in [facility]  Weekly dose as of last visit: 35mg   Dose as of last visit: 5mg  daily    SUBJECTIVE  Mark Poole is a 62 year old male on anticoagulation for AFib.  Contacted patient by phone for INR results and follow-up today.  Pt cardioverted 01/31/13.  Will continue weekly INRs x 4 weeks  Pt reports taking a few ibuprofen for a toothache.    LIFESTYLE  -Diet: Reports that he is using salt substitute rather than table salt.   Denies other dietary changes except eating more red meat versus chicken, denies changes in dietary Vit K intake.   -Substance Use: Today states no alcohol in the last week   -Exercise: Slightly increased - working more  -Other: works as a Music therapist    REVIEW OF SYSTEMS  -ANTICOAGULATION:        -BLEEDING SYMPTOMS: Denies symptoms       -THROMBOEMBOLISM SYMPTOMS            -STROKE: Denies symptoms          RESULTS REVIEW  INR      2.8   02/07/2013  INR   *   02/07/2013    Value: Please see patient's medical record for results.   Lab  ordered is the same as other provider's request.  INR      2.9   01/31/2013    ASSESSMENT  INR therapeutic without complications.      PLAN   1. Continue Warfarin 5mg  daily x4   2. Re-check INR 02/15/13   3. Educated pt regarding use of acetaminophen for pain. Ibuprofen only for severe pain, use occasionally    TIME SPENT  -Total time spent teaching: 3 minutes  -Total visit time: 7 minutes

## 2013-02-08 NOTE — Progress Notes (Signed)
Patient is here for a Nurse Visit on 02/07/2013 as ordered by Dr. Lucina Mellow after cardioversion was preformed on 01/31/13 for chronic atrial fibrillation.    Patient reports that he felt great after having his cardioversion. He was able to think better and do more. Patient thought went back into atrial fibrillation a few days after the cardioversion. Patient reports he has returned to metoprolol succinate 100 mg, PO, BID. Patient denies chest pain, shortness of breath, no dizziness, syncope or near syncope. Patient denies orthopnea, PND or BLE edema. Patients does have complaints of feeling "foggy" and that be feeling like he is hold on to some fluid. In the past patient will take an extra dose of lasix, he reports he will do this today. Patient takes his weight daily and will extra attention to his weights right now.     Irregular rhythm, rate controlled. No BLE edema. Lungs CTA.    EKG 89HR atrial fibrillation    Patient Active Problem List   Diagnosis   . Atrial fibrillation   . Left atrial thrombus   . Alcohol abuse, in remission   . HFrEF (heart failure with reduced ejection fraction)   . Chronic anticoagulation       Vitals:  Filed Vitals:    02/07/13 1249   BP: 111/68   Pulse: 89   Resp: 16   Weight: 188 lb 7.9 oz (85.5 kg)     Weights <=183lbs<=180<=179.12lbs      Medications:  Current Outpatient Prescriptions   Medication Sig   . Digoxin (DIGOX) 125 MCG Oral Tab 1 TABLET DAILY   . Furosemide 40 MG Oral Tab 1 TABLET DAILY, BID   . Lisinopril 20 MG Oral Tab Take 1 tablet (20 mg) by mouth daily.   . Metoprolol Succinate ER 50 MG Oral TABLET SR 24 HR Take 1 tablet (50 mg) by mouth daily. Do not chew or crush.   . Warfarin Sodium 5 MG Oral Tab Take 1 tablet (5mg ) by mouth every day, or take as directed by [facility].     No current facility-administered medications for this visit.       Lab Results:    INR 2.8    Plan:  Spoke to Dr. Lucina Mellow who ordered below:  1. When patient returns he will need a INR  check.  2. Repeat Echocardiogram  3. Appointment with Dr. Lucina Mellow in the first two weeks of December. At this time patient will be assessed if he could be put on amiodarone and re-cardioverted.     Patient was notified of the plan.

## 2013-02-10 ENCOUNTER — Other Ambulatory Visit (HOSPITAL_BASED_OUTPATIENT_CLINIC_OR_DEPARTMENT_OTHER): Payer: Self-pay | Admitting: Internal Medicine

## 2013-02-10 DIAGNOSIS — I5020 Unspecified systolic (congestive) heart failure: Secondary | ICD-10-CM

## 2013-02-11 ENCOUNTER — Other Ambulatory Visit (HOSPITAL_BASED_OUTPATIENT_CLINIC_OR_DEPARTMENT_OTHER): Payer: Self-pay

## 2013-02-11 DIAGNOSIS — I4891 Unspecified atrial fibrillation: Secondary | ICD-10-CM

## 2013-02-14 ENCOUNTER — Ambulatory Visit (HOSPITAL_BASED_OUTPATIENT_CLINIC_OR_DEPARTMENT_OTHER): Payer: No Typology Code available for payment source | Attending: Pharmacist | Admitting: Pharmacotherapy

## 2013-02-14 DIAGNOSIS — I4891 Unspecified atrial fibrillation: Secondary | ICD-10-CM | POA: Insufficient documentation

## 2013-02-14 DIAGNOSIS — I5189 Other ill-defined heart diseases: Secondary | ICD-10-CM | POA: Insufficient documentation

## 2013-02-14 DIAGNOSIS — I513 Intracardiac thrombosis, not elsewhere classified: Secondary | ICD-10-CM

## 2013-02-14 LAB — PROTHROMBIN TIME
Prothrombin INR: 2.2 — ABNORMAL HIGH (ref 0.8–1.3)
Prothrombin Time Patient: 24 s — ABNORMAL HIGH (ref 10.7–15.6)

## 2013-02-14 NOTE — Telephone Encounter (Signed)
BLOOD DRAW LOCATION: [facility]    CALL FOR RESULTS: Self  Phone: [phone]    ANTICOAGULATION TREATMENT PLAN    Indication: A Fib (CHADS2=1, CHF)  Goal INR: 2-3  Duration of Therapy: Lifelong    Hemorrhagic Risk Score: 0  Warfarin Tablet Size:     Relevant Historic Information:     Referring Provider: Dr. Lucina Mellow [facility] cardiology, Dr. Carlynn Herald [phone]    SUBJECTIVE:   Patient states no changes in diet, exercise or medications.  No signs/symptoms of VTE or bleeding/bruising.    OBJECTIVE:   Present dose: warfarin 5mg  q day  Relevant medication changes: None      LABS:   INR      2.2   02/14/2013  INR      2.8   02/07/2013  INR   *   02/07/2013    Value: Please see patient's medical record for results.   Lab  ordered is the same as other provider's request.       ASSESSMENT:   Therapeutic INR but lower than last two weeks. No explanation as to the drop.    PLAN:   1. Continue warfarin 5mg  q day. Patient wondered if he needed to take an extra 1/2 dose this week but I explained that you don't want to change the dose when you have a therapeutic INR.    2. Return in about 7 days (around 02/21/2013).   3. Pt acknowledged understanding of this plan        Baldomero Lamy, BSPharm

## 2013-02-15 ENCOUNTER — Telehealth (HOSPITAL_BASED_OUTPATIENT_CLINIC_OR_DEPARTMENT_OTHER): Payer: No Typology Code available for payment source

## 2013-02-15 ENCOUNTER — Other Ambulatory Visit (HOSPITAL_BASED_OUTPATIENT_CLINIC_OR_DEPARTMENT_OTHER): Payer: Self-pay | Admitting: Pharmacist

## 2013-02-15 DIAGNOSIS — I4891 Unspecified atrial fibrillation: Secondary | ICD-10-CM

## 2013-02-15 DIAGNOSIS — I513 Intracardiac thrombosis, not elsewhere classified: Secondary | ICD-10-CM

## 2013-02-16 ENCOUNTER — Other Ambulatory Visit (HOSPITAL_BASED_OUTPATIENT_CLINIC_OR_DEPARTMENT_OTHER): Payer: No Typology Code available for payment source

## 2013-02-21 ENCOUNTER — Telehealth (HOSPITAL_BASED_OUTPATIENT_CLINIC_OR_DEPARTMENT_OTHER): Payer: Self-pay

## 2013-02-21 ENCOUNTER — Other Ambulatory Visit (HOSPITAL_BASED_OUTPATIENT_CLINIC_OR_DEPARTMENT_OTHER): Payer: Self-pay | Admitting: Pharmacotherapy

## 2013-02-21 DIAGNOSIS — I513 Intracardiac thrombosis, not elsewhere classified: Secondary | ICD-10-CM

## 2013-02-21 DIAGNOSIS — I4891 Unspecified atrial fibrillation: Secondary | ICD-10-CM

## 2013-02-23 ENCOUNTER — Ambulatory Visit (HOSPITAL_BASED_OUTPATIENT_CLINIC_OR_DEPARTMENT_OTHER): Payer: No Typology Code available for payment source | Attending: Internal Medicine

## 2013-02-23 DIAGNOSIS — I4891 Unspecified atrial fibrillation: Secondary | ICD-10-CM | POA: Insufficient documentation

## 2013-02-23 DIAGNOSIS — I513 Intracardiac thrombosis, not elsewhere classified: Secondary | ICD-10-CM

## 2013-02-23 DIAGNOSIS — I5189 Other ill-defined heart diseases: Secondary | ICD-10-CM | POA: Insufficient documentation

## 2013-02-23 LAB — PROTHROMBIN TIME
Prothrombin INR: 3.6 — ABNORMAL HIGH (ref 0.8–1.3)
Prothrombin Time Patient: 35 s — ABNORMAL HIGH (ref 10.7–15.6)

## 2013-02-23 NOTE — Telephone Encounter (Signed)
BLOOD DRAW LOCATION: [facility]    CALL FOR RESULTS: Self  Phone: [phone]    ANTICOAGULATION TREATMENT PLAN    Indication: A Fib (CHADS2=1, CHF)  Goal INR: 2-3  Duration of Therapy: Lifelong    Hemorrhagic Risk Score: 0  Warfarin Tablet Size: 5mg     Relevant Historic Information:     Referring Provider: Dr. Lucina Mellow [facility] cardiology, Dr. Carlynn Herald [phone]    SUBJECTIVE:   Pt reports drinking a few alcoholic drinks over the weekend. Acetaminophen 1-2 tablets /day for the past 2 days due to toothache. No change in diet or activity level    OBJECTIVE:   Present dose: 5mg  qday  Relevant medication changes: none    LABS:   INR      3.6   02/23/2013  INR      2.2   02/14/2013  INR      2.8   02/07/2013       ASSESSMENT:   Supratherapeutic INR- possibly due ETOH    PLAN:   1. Warfarin 2.5mg  today only, then resume 5mg  daily   2. F/up 12/11 has cardiology appt same day  3. Pt acknowledged understanding of this plan        Tilda Franco, PharmD

## 2013-03-03 ENCOUNTER — Encounter (HOSPITAL_BASED_OUTPATIENT_CLINIC_OR_DEPARTMENT_OTHER): Payer: Self-pay | Admitting: Emergency Medicine

## 2013-03-03 ENCOUNTER — Emergency Department (HOSPITAL_BASED_OUTPATIENT_CLINIC_OR_DEPARTMENT_OTHER): Payer: 59

## 2013-03-03 ENCOUNTER — Emergency Department (HOSPITAL_BASED_OUTPATIENT_CLINIC_OR_DEPARTMENT_OTHER)
Admission: EM | Admit: 2013-03-03 | Discharge: 2013-03-03 | Disposition: A | Discharge status: Home / Self Care | Payer: 59 | Attending: Emergency Medicine | Admitting: Emergency Medicine

## 2013-03-03 ENCOUNTER — Ambulatory Visit (HOSPITAL_BASED_OUTPATIENT_CLINIC_OR_DEPARTMENT_OTHER): Payer: No Typology Code available for payment source

## 2013-03-03 ENCOUNTER — Ambulatory Visit (HOSPITAL_BASED_OUTPATIENT_CLINIC_OR_DEPARTMENT_OTHER): Payer: No Typology Code available for payment source | Attending: Internal Medicine | Admitting: Internal Medicine

## 2013-03-03 ENCOUNTER — Ambulatory Visit (HOSPITAL_BASED_OUTPATIENT_CLINIC_OR_DEPARTMENT_OTHER): Payer: No Typology Code available for payment source | Admitting: Pharmacotherapy

## 2013-03-03 ENCOUNTER — Encounter (HOSPITAL_BASED_OUTPATIENT_CLINIC_OR_DEPARTMENT_OTHER): Payer: Self-pay | Admitting: Internal Medicine

## 2013-03-03 ENCOUNTER — Other Ambulatory Visit (HOSPITAL_BASED_OUTPATIENT_CLINIC_OR_DEPARTMENT_OTHER): Payer: Self-pay | Admitting: Internal Medicine

## 2013-03-03 VITALS — BP 120/88 | HR 82 | Temp 97.9°F | Resp 18 | Ht 71.0 in | Wt 189.0 lb

## 2013-03-03 DIAGNOSIS — I513 Intracardiac thrombosis, not elsewhere classified: Secondary | ICD-10-CM

## 2013-03-03 DIAGNOSIS — I4891 Unspecified atrial fibrillation: Secondary | ICD-10-CM

## 2013-03-03 DIAGNOSIS — I5020 Unspecified systolic (congestive) heart failure: Secondary | ICD-10-CM

## 2013-03-03 DIAGNOSIS — I5189 Other ill-defined heart diseases: Secondary | ICD-10-CM | POA: Insufficient documentation

## 2013-03-03 DIAGNOSIS — C8590 Non-Hodgkin lymphoma, unspecified, unspecified site: Secondary | ICD-10-CM | POA: Insufficient documentation

## 2013-03-03 DIAGNOSIS — I509 Heart failure, unspecified: Secondary | ICD-10-CM | POA: Insufficient documentation

## 2013-03-03 DIAGNOSIS — Z79899 Other long term (current) drug therapy: Secondary | ICD-10-CM | POA: Insufficient documentation

## 2013-03-03 DIAGNOSIS — J189 Pneumonia, unspecified organism: Secondary | ICD-10-CM

## 2013-03-03 DIAGNOSIS — IMO0002 Reserved for concepts with insufficient information to code with codable children: Secondary | ICD-10-CM | POA: Insufficient documentation

## 2013-03-03 DIAGNOSIS — J441 Chronic obstructive pulmonary disease with (acute) exacerbation: Secondary | ICD-10-CM

## 2013-03-03 DIAGNOSIS — J159 Unspecified bacterial pneumonia: Secondary | ICD-10-CM | POA: Insufficient documentation

## 2013-03-03 DIAGNOSIS — K219 Gastro-esophageal reflux disease without esophagitis: Secondary | ICD-10-CM | POA: Insufficient documentation

## 2013-03-03 DIAGNOSIS — E079 Disorder of thyroid, unspecified: Secondary | ICD-10-CM | POA: Insufficient documentation

## 2013-03-03 DIAGNOSIS — F17200 Nicotine dependence, unspecified, uncomplicated: Secondary | ICD-10-CM | POA: Insufficient documentation

## 2013-03-03 HISTORY — DX: Chronic obstructive pulmonary disease, unspecified: J44.9

## 2013-03-03 HISTORY — DX: Gastro-esophageal reflux disease without esophagitis: K21.9

## 2013-03-03 HISTORY — DX: Disorder of thyroid, unspecified: E07.9

## 2013-03-03 LAB — HEPATIC FUNCTION PANEL
ALT (GPT): 16 U/L (ref 10–48)
AST (GOT): 20 U/L (ref 15–40)
Albumin: 4.5 g/dL (ref 3.5–5.2)
Alkaline Phosphatase (Total): 74 U/L (ref 37–159)
Bilirubin (Direct): 0.1 mg/dL (ref 0.0–0.3)
Bilirubin (Total): 0.5 mg/dL (ref 0.2–1.3)
Protein (Total): 8.1 g/dL (ref 6.0–8.2)

## 2013-03-03 LAB — PROTHROMBIN TIME
Prothrombin INR: 3.3 — ABNORMAL HIGH (ref 0.8–1.3)
Prothrombin Time Patient: 33 s — ABNORMAL HIGH (ref 10.7–15.6)

## 2013-03-03 LAB — DIGOXIN: Digoxin: 0.3 ng/mL — ABNORMAL LOW (ref 0.5–1.9)

## 2013-03-03 LAB — THYROID STIMULATING HORMONE: Thyroid Stimulating Hormone: 4.507 u[IU]/mL (ref 0.400–5.000)

## 2013-03-03 LAB — 1ST EXTRA LIME GREEN TOP

## 2013-03-03 LAB — T4, FREE: Thyroxine (Free): 0.9 ng/dL (ref 0.6–1.2)

## 2013-03-03 MED ORDER — ALBUTEROL SULFATE HFA 108 (90 BASE) MCG/ACT IN AERS
INHALATION_SPRAY | RESPIRATORY_TRACT | Status: AC
  Administered 2013-03-03: 2 via RESPIRATORY_TRACT
  Filled 2013-03-03: qty 6.7

## 2013-03-03 MED ORDER — ALBUTEROL SULFATE HFA 108 (90 BASE) MCG/ACT IN AERS
2.0000 | INHALATION_SPRAY | RESPIRATORY_TRACT | Status: DC
  Administered 2013-03-03: 2 via RESPIRATORY_TRACT

## 2013-03-03 MED ORDER — DOXYCYCLINE HYCLATE 100 MG PO TABS
100.0000 mg | ORAL_TABLET | Freq: Once | ORAL | Status: AC
  Administered 2013-03-03: 100 mg via ORAL
  Filled 2013-03-03: qty 1

## 2013-03-03 MED ORDER — DOXYCYCLINE HYCLATE 100 MG PO CAPS: 100.0000 mg | ORAL_CAPSULE | Freq: Two times a day (BID) | ORAL | Status: DC

## 2013-03-03 MED ORDER — METHYLPREDNISOLONE 4 MG PO KIT: PACK | ORAL | Status: DC

## 2013-03-03 MED ORDER — DIGOXIN 62.5 MCG OR TABS
0.0625 mg | ORAL_TABLET | Freq: Every day | ORAL | Status: DC
Start: 2013-03-03 — End: 2013-03-07

## 2013-03-03 MED ORDER — AMIODARONE HCL 200 MG OR TABS
200.0000 mg | ORAL_TABLET | Freq: Three times a day (TID) | ORAL | Status: DC
Start: 2013-03-03 — End: 2013-04-04

## 2013-03-03 NOTE — Patient Instructions (Addendum)
1. Start amiodarone three times a day.  2. Keep same digoxin dose  3. Decrease metoprolol to 1 tablet twice a day (half current dose)  4. Check blood today and 3-5 days after starting amiodarone (early next week)  5. Schedule you for lung function testing  6. Schedule you for cardioversion in 1 month (mid-January)        Official reprint from UpToDate   www.uptodate.ZHY8657 UpToDate   PrintBack   The content on the UpToDate website is not intended nor recommended as a substitute for medical advice, diagnosis, or treatment. Always seek the advice of your own physician or other qualified health care professional regarding any medical questions or conditions. The use of this website is governed by the UpToDate Terms of Use2014 UpToDate, Inc.   Amiodarone: Patient drug information     Copyright (312)[phone] [employer] All rights reserved.   (For additional information see "Amiodarone: Drug information" and see "Amiodarone: Pediatric drug information")  Brand Names: [place]   Cordarone;   Nexterone;   Pacerone  Brand Names: [place]   Amiodarone Hydrochloride Injection;   Apo-Amiodarone;   Ava-Amiodarone;   Cordarone;   Dom-Amiodarone;   Mylan-Amiodarone;   PHL-Amiodarone;   PMS-Amiodarone;   PRO-Amiodarone;   ratio-Amiodarone;   Riva-Amiodarone;   Sandoz-Amiodarone;   Teva-Amiodarone  Warning   .This drug is only used to treat heartbeats that are not normal and that may be deadly. It may cause very bad and sometimes deadly side effects like lung, thyroid, or liver problems. This drug can also cause the heartbeats that are not normal to get worse. Talk with the doctor.   .This drug will be started in a hospital where you will be closely watched. Talk with your doctor.  What is this drug used for?   .It is used to treat heartbeats that are not normal.   .It may be given to you for other reasons. Talk with the doctor.  What do I need to tell my doctor BEFORE I take this drug?   .If you have an allergy to amiodarone,  iodine, or any other part of this drug.   .If you are allergic to any drugs like this one, any other drugs, foods, or other substances. Tell your doctor about the allergy and what signs you had, like rash; hives; itching; shortness of breath; wheezing; cough; swelling of face, lips, tongue, or throat; or any other signs.   .If you have any of these health problems: Low potassium or magnesium levels.   .If you have heart problems.   This is not a list of all drugs or health problems that interact with this drug.   Tell your doctor and pharmacist about all of your drugs (prescription or OTC, natural products, vitamins) and health problems. You must check to make sure that it is safe for you to take this drug with all of your drugs and health problems. Do not start, stop, or change the dose of any drug without checking with your doctor.  What are some things I need to know or do while I take this drug?   .Tell dentists, surgeons, and other doctors that you use this drug.   .To lower the chance of feeling dizzy or passing out, rise slowly over a few minutes when sitting or lying down. Be careful climbing stairs.   .Have your blood work checked often. Talk with your doctor.   .Have an ECG checked often. Talk with your doctor.   .Have your blood pressure  and heart rate checked often. Talk with your doctor.   .Have your heart and lung function checked. Talk with your doctor.   .Have an eye exam as you have been told by your doctor.   .If you have a defibrillator or pacemaker, talk with your doctor.   Marland KitchenAvoid grapefruit and grapefruit juice.   .A very bad eye problem has rarely happened with this drug. This may lead to a change in eyesight and sometimes loss of eyesight, which may not come back. Talk with the doctor.   .You may get sunburned more easily. Avoid sun, sunlamps, and tanning beds. Use sunscreen and wear clothing and eyewear that protects you from the sun.   .This drug stays in your body for weeks or months even  after you stop it. Tell dentists, surgeons, and other doctors that you have taken this drug.   .If you are 63 or older, use this drug with care. You could have more side effects.   .This drug may cause harm to the unborn baby if you take it while you are pregnant.   .Use birth control that you can trust to prevent pregnancy.   .Tell your doctor if you are pregnant or plan on getting pregnant. You will need to talk about the benefits and risks of using this drug while you are pregnant.   .Tell your doctor if you are breast-feeding. You will need to talk about any risks to your baby.  What are some side effects that I need to call my doctor about right away?   WARNING/CAUTION: Even though it may be rare, some people may have very bad and sometimes deadly side effects when taking a drug. Tell your doctor or get medical help right away if you have any of the following signs or symptoms that may be related to a very bad side effect:   .Signs of an allergic reaction, like rash; hives; itching; red, swollen, blistered, or peeling skin with or without fever; wheezing; tightness in the chest or throat; trouble breathing or talking; unusual hoarseness; or swelling of the mouth, face, lips, tongue, or throat.   .Signs of liver problems like dark urine, feeling tired, not hungry, upset stomach or stomach pain, light-colored stools, throwing up, or yellow skin or eyes.   .Signs of lung or breathing problems like shortness of breath or other trouble breathing, cough, or fever.   .Signs of thyroid problems like a change in weight without trying, feeling nervous and excitable, feeling restless, feeling very weak, hair thinning, low mood (depression), neck swelling, not able to focus, not able to handle heat or cold, period (menstrual) changes, shakiness, or sweating.   .Change in balance.   .Shakiness, trouble moving around, or stiffness.   .Blue or gray skin color.   .Coughing up blood.   .Chest pain or pressure.   .A burning,  numbness, or tingling feeling that is not normal.   .Fast or slow heartbeat.   Marland KitchenA new or worse heartbeat that does not feel normal.   .Change in eyesight, eye pain, or very bad eye irritation.   .If bright lights bother your eyes.   .Not able to pass urine or change in how much urine is passed.   .Very bad dizziness or passing out.   .Trouble controlling body movements.   .Joint pain.   .Muscle pain or weakness.   .Shortness of breath, a big weight gain, swelling in the arms or legs.   .Any bruising or bleeding.   Marland KitchenA  very bad skin reaction (Stevens-Johnson syndrome/toxic epidermal necrolysis) may happen. It can cause very bad health problems that may not go away, and sometimes death. Get medical help right away if you have signs like red, swollen, blistered, or peeling skin (with or without fever); red or irritated eyes; or sores in your mouth, throat, nose, or eyes.  What are some other side effects of this drug?   All drugs may cause side effects. However, many people have no side effects or only have minor side effects. Call your doctor or get medical help if any of these side effects or any other side effects bother you or do not go away:   .Hard stools (constipation).   Marland KitchenUpset stomach or throwing up.   .Feeling tired or weak.   .Not hungry.   These are not all of the side effects that may occur. If you have questions about side effects, call your doctor. Call your doctor for medical advice about side effects.   You may report side effects to your national health agency.  How is this drug best taken?   Use this drug as ordered by your doctor. Read and follow the dosing on the label closely.   Tablet:   .Take as you have been told, even if you feel well.   .To gain the most benefit, do not miss doses.   .Take with or without food. Always take with food or always take on an empty stomach.   Shot:   .It is given as a shot into a vein.  What do I do if I miss a dose?   Tablet:   .Skip the missed dose and go back to  your normal time.   .Do not take 2 doses at the same time or extra doses.   Shot:   .Call the doctor to find out what to do.  How do I store and/or throw out this drug?   Tablet:   .Store at room temperature.   .Protect from light.   .Store in a dry place. Do not store in a bathroom.   Shot:   .This drug will be given to you in a hospital or doctor's office. You will not store it at home.   All products:   .Keep all drugs out of the reach of children and pets.   .Check with your pharmacist about how to throw out unused drugs.   General drug facts   .If your symptoms or health problems do not get better or if they become worse, call your doctor.   .Do not share your drugs with others and do not take anyone else's drugs.   Marland KitchenKeep a list of all your drugs (prescription, natural products, vitamins, OTC) with you. Give this list to your doctor.   .Talk with the doctor before starting any new drug, including prescription or OTC, natural products, or vitamins.   .Some drugs may have another patient information leaflet. If you have any questions about this drug, please talk with your doctor, pharmacist, or other health care provider.   .If you think there has been an overdose, call your poison control center or get medical care right away. Be ready to tell or show what was taken, how much, and when it happened.  Use of UpToDate is subject to the Subscription and License Agreement.   Topic D5359719 Version 78.0   Print Options:    Text

## 2013-03-03 NOTE — Progress Notes (Signed)
OUTPATIENT VISIT    ID/CHIEF COMPLAINT: Mark Poole is a 62 year old male here for CHF      HISTORY:  62 y/o male w/ HFrEF and atrial fibrillation returns for routine f/u after recent DCCV. He was back in NSR for a few days and reports feeling significantly better during that time - specifically more energy and improved memory. He then returned to afib and describes, since then, a return to his prior low energy. Denies CP, SOB, palpitations. Some LE edema with increased salt intake that resolves with an extra dose of lasix. Sleeps well. Adherent with medications. No bleeding of any kind.    Very interested in rhythm control strategy since he felt so much better in sinus.    REVIEW OF SYSTEMS:  A complete ROS was performed and is negative, except where noted in the above HPI.    PHYSICAL EXAM:  VITALS - Blood pressure 120/88, pulse 82, temperature 97.9 F (36.6 C), temperature source Temporal, resp. rate 18, height 5\' 11"  (1.803 m), weight 189 lb (85.73 kg), SpO2 99.00%.  GENERAL - well-appearing NAD  HEENT - anicteric MMM  RESPIRATORY - CTAB  CARDIOVASCULAR - irreg irreg, nl S1S2, no JVD, no LE edema, no MRG, no carotid bruit, no heave and normal PMI  ABDOMEN - soft NT +BS  SKIN - warm dry pink  NEURO - MAE=, face symmetric  MENTAL STATUS - AAOX3    DIAGNOSTIC STUDIES:  I personally reviewed in detail his most recent TEE images    01/31/13 TEE:  Spontaneous echo contrast but no thrombus seen in the left atrial appendage or left atrium.     Mildly reduced LV systolic function.    Normal valve structure and function.       ASSESSMENT/PLAN:  Anav was seen today for HFrEF and atrial fibrillation. He has NYHA class 1 HF symptoms and no exam findings of volume overload. He is on appropriate HF medical therapy. He has symptoms of fatigue associated with his atrial fibrillation but did not sustain sinus rhythm with most recent DCCV. Today we discussed rhythm vs rate control strategy, amiodarone benefits and side  effects, and the possibility of amio loading and then re-attempting DCCV as part of a rhythm control strategy. We discussed risks of lung toxicity up to 5% and risks of thyroid disease up to 5% as well as effects on other medications including digoxin, metoprolol and warfarin.    He will see our pharmacist today to review these issues, confirm current doses of meds, and arrange follow up with anticoag clinic.    We will start amiodarone 200 mg TID for 1 month then proceed to DCCV.    We will check LFTs, TFTs today, check PFTs, check digoxin level in 3-5 days.    We will decrease metoprolol and digoxin doses.    Diagnoses and associated orders for this visit:    HFrEF (heart failure with reduced ejection fraction)  - Amiodarone HCl 200 MG Oral Tab; Take 1 tablet (200 mg) by mouth 3 times a day.  - HEPATIC FUNCTION PANEL  - THYROID STIMULATING HORMONE  - T4, FREE  - REFERRAL TO [facility]  - Digoxin (DIGOX) 62.5 MCG Oral Tab; Take 0.0625 mg by mouth daily.  - DIGOXIN    Atrial fibrillation  - Amiodarone HCl 200 MG Oral Tab; Take 1 tablet (200 mg) by mouth 3 times a day.  - HEPATIC FUNCTION PANEL  - THYROID STIMULATING HORMONE  - T4, FREE  -  REFERRAL TO [facility]  - Digoxin (DIGOX) 62.5 MCG Oral Tab; Take 0.0625 mg by mouth daily.  - DIGOXIN            Return in about 2 months (around 05/04/2013) for response to amiodarone and DCCV.        ------------------------------------------------------------------------------------------------------------------  SUPPLEMENTAL INFO:    PROBLEM LIST:  Patient Active Problem List    Diagnosis Date Noted   . NHL (non-Hodgkin's lymphoma) 03/03/2013     In lungs  Radiation therapy     . Chronic anticoagulation 02/02/2013     [facility] enrolled 08/19/12     . 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 HFrEF (heart failure with reduced ejection fraction) 09/03/2012     07/08/12 TTE: EF 36%.Left ventricle is normal in size and thickness. Moderate global hypokinesis. Right  ventricle is normal in size with moderate systolic dysfunction. Mild-moderate tricuspid regurgitation: estimated pulmonary artery systolic pressures are normal (29-34 mmHg).  Moderate mitral regurgitation.Trileaflet aortic valve with mild valvular stenosis and trace regurgitation.No pericardial effusion       . Left atrial thrombus 08/30/2012     08/30/12. TEE. Small thrombus in the left atrial appendage. A bright immobile structure in the appendage is again visualized, possibly representing a pectinate muscle vs calcified thrombus. Some views show an aspect of this mass that appears consistent with mobile thrombus. There is spontaneous echo contrast in the appendage and left atrium. Cardioversion was thus deferred.          . Atrial fibrillation 08/19/2012        SOCIAL HISTORY:  History     Social History   . Marital Status: Single     Spouse Name: N/A     Number of Children: N/A   . Years of Education: N/A     Occupational History   . Not on file.     Social History Main Topics   . Smoking status: Former Smoker -- 1.00 packs/day for 20 years     Types: Cigarettes   . Smokeless tobacco: Not on file   . Alcohol Use: No   . Drug Use: No   . Sexually Active: Not on file     Other Topics Concern   . Not on file     Social History Narrative   . No narrative on file       CURRENT MEDICATIONS:  as of end of visit on 03/03/2013    Current Outpatient Prescriptions   Medication Sig   . Amiodarone HCl 200 MG Oral Tab Take 1 tablet (200 mg) by mouth 3 times a day.   . Digoxin (DIGOX) 62.5 MCG Oral Tab Take 0.0625 mg by mouth daily.   . Furosemide 40 MG Oral Tab 1 TABLET DAILY, BID   . Lisinopril 20 MG Oral Tab Take 1 tablet (20 mg) by mouth daily.   . Metoprolol Succinate ER 50 MG Oral TABLET SR 24 HR Take 1 tablet (50 mg) by mouth 2 times a day. Do not chew or crush.   . Warfarin Sodium 5 MG Oral Tab Take 1 tablet (5mg ) by mouth every day, or take as directed by [facility].     No current facility-administered  medications for this visit.       ALLERGIES:  Review of patient's allergies indicates:  No Known Allergies     VITAL SIGN TRENDS:       Wt Readings from Last 5 Encounters:   03/03/13 189  lb (85.73 kg)   02/07/13 188 lb 7.9 oz (85.5 kg)   01/31/13 186 lb 1.1 oz (84.4 kg)   01/20/13 186 lb (84.369 kg)   09/16/12 179 lb 12.8 oz (81.557 kg)          BP Readings from Last 5 Encounters:   03/03/13 120/88   02/07/13 111/68   01/31/13 112/65   01/20/13 126/88   09/16/12 111/81        STUDIES:  Orders Only on 03/03/2013   Component Date Value Range Status   . Prothrombin Time Patient 03/03/2013 33.0* 10.7 - 15.6 s Final   . Prothrombin INR 03/03/2013 3.3* 0.8 - 1.3 Final

## 2013-03-03 NOTE — ED Provider Notes (Addendum)
CSN: [id number]     Arrival date & time 03/03/13  0534 History   First MD Initiated Contact with Patient 03/03/13 0546     Chief Complaint  Patient presents with  . Cough   (Consider location/radiation/quality/duration/timing/severity/associated sxs/prior Treatment) HPI Is a 62 year old male with history of COPD. He is here with 3 weeks of coughing. The coughing has been associated with weakness and chills but no fever. He finished a Z-Pak yesterday. He is on Symbicort but not albuterol. He states the cough is severe enough it is keeping him awake at night. He denies shortness of breath, dyspnea on exertion, chest pain or abdominal pain. He has not had any nausea, vomiting or diarrhea. He was noted to drop his oxygen saturation to 89% but this improves into the mid 90s with deep breathing.  Past Medical History  Diagnosis Date  . Thyroid disease   . GERD (gastroesophageal reflux disease)   . COPD (chronic obstructive pulmonary disease)    Past Surgical History  Procedure Laterality Date  . Hernia repair     History reviewed. No pertinent family history. History  Substance Use Topics  . Smoking status: Current Every Day Smoker -- 1.00 packs/day    Types: Cigarettes  . Smokeless tobacco: Not on file  . Alcohol Use: No    Review of Systems  All other systems reviewed and are negative.    Allergies  Review of patient's allergies indicates no known allergies.  Home Medications   Current Outpatient Rx  Name  Route  Sig  Dispense  Refill  . azithromycin (ZITHROMAX) 250 MG tablet   Oral   Take by mouth daily.         . budesonide-formoterol (SYMBICORT) 160-4.5 MCG/ACT inhaler   Inhalation   Inhale 2 puffs into the lungs 2 (two) times daily.         . clidinium-chlordiazePOXIDE (LIBRAX) 5-2.5 MG per capsule   Oral   Take 1 capsule by mouth.         . dextromethorphan-guaiFENesin (MUCINEX DM) 30-600 MG per 12 hr tablet   Oral   Take 1 tablet by mouth 2 (two) times  daily.         . fexofenadine (ALLEGRA) 180 MG tablet   Oral   Take 180 mg by mouth daily.         . fluticasone (FLONASE) 50 MCG/ACT nasal spray   Each Nare   Place 2 sprays into both nostrils daily.         Marland Kitchen levothyroxine (SYNTHROID, LEVOTHROID) 137 MCG tablet   Oral   Take 137 mcg by mouth daily before breakfast.         . pantoprazole (PROTONIX) 40 MG tablet   Oral   Take 40 mg by mouth daily.         . sertraline (ZOLOFT) 25 MG tablet   Oral   Take 25 mg by mouth daily.          BP 123/75  Pulse 93  Temp(Src) 98.2 F (36.8 C) (Oral)  Resp 20  Ht 6' (1.829 m)  Wt 184 lb (83.462 kg)  BMI 24.95 kg/m2  SpO2 89%  Physical Exam General: Well-developed, well-nourished male in no acute distress; appearance consistent with age of record HENT: normocephalic; atraumatic Eyes: pupils equal, round and reactive to light; extraocular muscles intact Neck: supple Heart: regular rate and rhythm; distant sounds Lungs: No wheezing; some air trapping Abdomen: soft; nondistended; nontender; no masses or hepatosplenomegaly Extremities: No  deformity; full range of motion Neurologic: Awake, alert and oriented; motor function intact in all extremities and symmetric; no facial droop Skin: Warm and dry Psychiatric: Normal mood and affect    ED Course  Procedures (including critical care time)  MDM  Nursing notes and vitals signs, including pulse oximetry, reviewed.  Summary of this visit's results, reviewed by myself:  Imaging Studies: Dg Chest 2 View  03/03/2013   CLINICAL DATA:  Cough and congestion.  History of smoking.  EXAM: CHEST  2 VIEW  COMPARISON:  None.  FINDINGS: The lungs are well-aerated. Patchy left-sided airspace opacity raises concern for pneumonia. There is no evidence of pleural effusion or pneumothorax.  The heart is normal in size; the mediastinal contour is within normal limits. No acute osseous abnormalities are seen.  IMPRESSION: Patchy  left-sided airspace opacity raises concern for pneumonia.   Electronically Signed   By: Roanna Raider M.D.   On: 03/03/2013 06:24   6:31 AM Air movement and cough improved after albuterol inhaler treatment. Chest x-ray findings are concerning for pneumonia. Patient should still be covered by Zithromax in his system for 5 more days, but we'll extend antibiotic treatment with doxycycline and steroid Dosepak.       Hanley Seamen, MD 03/03/13 1610  Hanley Seamen, MD 03/03/13 [phone]

## 2013-03-03 NOTE — Patient Instructions (Signed)
Instructed patient on the proper use of administering albuteral mdi via aerochamber patient tolerated well

## 2013-03-03 NOTE — ED Notes (Signed)
Patient transported to X-ray 

## 2013-03-03 NOTE — ED Notes (Signed)
Cough for few weeks but got worse on Monday with chills but denies fever.

## 2013-03-04 NOTE — Telephone Encounter (Signed)
BLOOD DRAW LOCATION: [facility]    CALL FOR RESULTS: Self  Phone: [phone]    ANTICOAGULATION TREATMENT PLAN    Indication: A Fib (CHADS2=1, CHF)  Goal INR: 2-3  Duration of Therapy: Lifelong    Hemorrhagic Risk Score: 0  Warfarin Tablet Size: 5mg     Relevant Historic Information:     Referring Provider: Dr. Lucina Mellow [facility] cardiology, Dr. Carlynn Herald [phone]    SUBJECTIVE:   62 year old male on chronic anticoagulation for AFib.  Received call from clinical pharmacist in Cardiology Clinic: patient is starting Amiodarone 200mg  TID x 4 weeks, then 200mg  daily thereafter.  Patient is also to be scheduled for cardioversion in January.  Will plan for 4 weekly INRs p/p cardioversion.    OBJECTIVE:   Present dose: 5mg  qday  Relevant medication changes: Amiodarone 200mg  TID x 4 weeks started on 03/03/13; then decrease to 200mg  daily thereafter (around 03/31/13).    LABS:   INR      3.3   03/03/2013  INR      3.6   02/23/2013  INR      2.2   02/14/2013     ASSESSMENT:   INR still slightly supratherapeutic though trending down.  Amiodarone will increase INR; will need to decrease Warfarin dose and continue to monitor closely until new maintenance Warfarin dose established.  Also patient is to be cardioverted next month, so will plan for 4 weekly INRs p/p cardioversion.    PLAN:   1. Decrease Warfarin to 2.5mg  TTSat and 5mg  MWFSun = 27.5mg  for now.   2. Re-check INR 03/09/13 for further Warfarin dosing instructions.  3. Pt acknowledged understanding of this plan

## 2013-03-07 MED ORDER — DIGOXIN 62.5 MCG OR TABS
ORAL_TABLET | ORAL | Status: DC
Start: 2013-03-07 — End: 2013-04-04

## 2013-03-07 NOTE — Progress Notes (Signed)
ID  Visit type: Tag-on  Interpreter present: None  Referral type: Verbal by Dr. Rick Duff on 03/03/13 for: Medication teaching    SUBJECTIVE  Mark Poole is a 62 year old male seen during visit with Dr. Lucina Mellow in Cardiology Clinic to discuss amiodarone initiation and review medication changes made today. Plan is to load with 200mg  TID x 1 month, cardiovert, then maintain with 200mg /day. Dig to be lowered to 0.0625mg  daily, and metoprolol to ER to 50mg  BID.     Upon review of med dose changes, pt. states he has already been taking 0.125mg  of digoxin as 1/2 tab daily for several months; also has been taking metoprolol as 2 tablets BID and his home med list that he carries with him indicates this is a 100mg  tablet, confirmed by his pharmacy. He was also dispensed a 50mg  strength mid-November as well so he is unsure what he has been taking.     PROBLEM LIST  Patient Active Problem List   Diagnosis   . Atrial fibrillation   . Left atrial thrombus   . Alcohol abuse, in remission   . HFrEF (heart failure with reduced ejection fraction)   . Chronic anticoagulation   . NHL (non-Hodgkin's lymphoma)       ALLERGIES  Review of patient's allergies indicates:  No Known Allergies    RESULTS REVIEW   Vitals 03/03/2013   Height 5\' 11"    Weight 189 lb   BMI (kg/m2) 26.36   Temp 97.9   Systolic 120   Diastolic 88   BP Cuff Size Regular   Pulse 82   Resp 18   SpO2 99%   Pain Level      Last Dig level drawn 01/31/13 was 0.4ng/ml; K+=4.7    INR      3.3   03/03/2013  INR      3.6   02/23/2013  INR      2.2   02/14/2013      ASSESSMENT/PLAN    MTM: Starting amiodarone load today. Will need dose reductions of metoprolol and warfarin due to ddi's. Recommend continuing current dose of digoxin as pt is currently taking less than prescribed, and had a low serum level last month.     1. MEDS: adding amiodarone as noted above. DECREASE metoprolol ER to 1 tablet BID. This will be either 100mg  BID or 50mg  BID depending on what he has been  taking. Advised he will need to DECREASE his warfarin dose but will defer this to the Anticoagulation Clinic.   2. ED: Reviewed ADRs of amiodarone, monitoring parameters, and importance of following through. Discussed drug inx and changes above.    3. F/u next week with Anticoagulation Clinic. Plan to recheck dig level at that time as well. Will contact pt if further changes need to be made.    MEDICATIONS  Current Outpatient Prescriptions   Medication Sig   . Amiodarone HCl 200 MG Oral Tab Take 1 tablet (200 mg) by mouth 3 times a day.   . Digoxin (DIGOX) 62.5 MCG Oral Tab Take 0.0625 mg by mouth daily.   . Furosemide 40 MG Oral Tab 1 TABLET DAILY, BID   . Lisinopril 20 MG Oral Tab Take 1 tablet (20 mg) by mouth daily.   . Metoprolol Succinate ER 50 MG Oral TABLET SR 24 HR Take 1 tablet (50 mg) by mouth 2 times a day. Do not chew or crush.   . Warfarin Sodium 5 MG Oral Tab Take 1  tablet (5mg ) by mouth every day, or take as directed by [facility] Anticoagulation [facility].     No current facility-administered medications for this visit.       EDUCATION  -Primary learner(s): Patient  -Patient challenges to learning: None  -Challenges impacting this teaching session: None  -Education materials provided: written dosing instructions  -Post education response: states understanding and restates information    TIME SPENT  -Total time spent teaching: 15 minutes  -Total visit time: 20 minutes    Eulah Pont, PharmD, BCPS

## 2013-03-09 ENCOUNTER — Telehealth (HOSPITAL_BASED_OUTPATIENT_CLINIC_OR_DEPARTMENT_OTHER): Payer: No Typology Code available for payment source

## 2013-03-09 ENCOUNTER — Other Ambulatory Visit (HOSPITAL_BASED_OUTPATIENT_CLINIC_OR_DEPARTMENT_OTHER): Payer: Self-pay | Admitting: Internal Medicine

## 2013-03-09 DIAGNOSIS — I4891 Unspecified atrial fibrillation: Secondary | ICD-10-CM

## 2013-03-09 DIAGNOSIS — I513 Intracardiac thrombosis, not elsewhere classified: Secondary | ICD-10-CM

## 2013-03-10 ENCOUNTER — Telehealth (HOSPITAL_BASED_OUTPATIENT_CLINIC_OR_DEPARTMENT_OTHER): Payer: Self-pay

## 2013-03-11 ENCOUNTER — Ambulatory Visit (HOSPITAL_BASED_OUTPATIENT_CLINIC_OR_DEPARTMENT_OTHER): Payer: Self-pay | Attending: Internal Medicine

## 2013-03-11 DIAGNOSIS — I4891 Unspecified atrial fibrillation: Secondary | ICD-10-CM

## 2013-03-11 DIAGNOSIS — I513 Intracardiac thrombosis, not elsewhere classified: Secondary | ICD-10-CM

## 2013-03-11 LAB — PROTHROMBIN TIME
Prothrombin INR: 2.4 — ABNORMAL HIGH (ref 0.8–1.3)
Prothrombin Time Patient: 25.8 s — ABNORMAL HIGH (ref 10.7–15.6)

## 2013-03-12 NOTE — Telephone Encounter (Addendum)
BLOOD DRAW LOCATION: [facility]    CALL FOR RESULTS: Self  Phone: [phone]    ANTICOAGULATION TREATMENT PLAN    Indication: A Fib (CHADS2=1, CHF)  Goal INR: 2-3  Duration of Therapy: Lifelong    Hemorrhagic Risk Score: 0  Warfarin Tablet Size: 5mg     Relevant Historic Information:     Referring Provider: Dr. Lucina Mellow [facility] cardiology, Dr. Carlynn Herald [phone]    SUBJECTIVE:   62 year old male on chronic anticoagulation for AFib.  He confirms taking his decreased Warfarin dose as listed below, denies any missed or extra doses.  On 03/03/13 he had started Amiodarone 200mg  TID x 4 weeks, then 200mg  daily thereafter.  Patient is also to be scheduled for cardioversion in January.  Will plan for 4 weekly INRs p/p cardioversion.    OBJECTIVE:   Present dose: Warfarin decreased on 03/03/13 to 2.5mg  TTSat and 5mg  MWFSun = 27.5mg /week (from 35mg /week)  Relevant medication changes: Amiodarone 200mg  TID x 4 weeks started on 03/03/13; then decrease to 200mg  daily thereafter (around 03/31/13).    LABS:   INR      2.4   03/11/2013  INR      3.3   03/03/2013  INR      3.6   02/23/2013     ASSESSMENT:   INR therapeutic.  Amiodarone will increase INR; will need to monitor closely in order to make timely adjustments and until new maintenance Warfarin dose established.  Also patient is to be cardioverted next month, so will plan for 4 weekly INRs p/p cardioversion.    PLAN:   1. Continue Warfarin to 2.5mg  TTSat and 5mg  MWFSun = 27.5mg  for now.   2. Re-check INR 03/18/13.  3. Pt acknowledged understanding of this plan

## 2013-03-14 ENCOUNTER — Encounter (HOSPITAL_BASED_OUTPATIENT_CLINIC_OR_DEPARTMENT_OTHER): Payer: Self-pay

## 2013-03-14 ENCOUNTER — Telehealth (HOSPITAL_BASED_OUTPATIENT_CLINIC_OR_DEPARTMENT_OTHER): Payer: Self-pay

## 2013-03-14 ENCOUNTER — Other Ambulatory Visit (HOSPITAL_BASED_OUTPATIENT_CLINIC_OR_DEPARTMENT_OTHER): Payer: Self-pay | Admitting: Pharmacotherapy

## 2013-03-14 DIAGNOSIS — I4891 Unspecified atrial fibrillation: Secondary | ICD-10-CM

## 2013-03-18 ENCOUNTER — Other Ambulatory Visit (HOSPITAL_BASED_OUTPATIENT_CLINIC_OR_DEPARTMENT_OTHER): Payer: Self-pay | Admitting: Pharmacotherapy

## 2013-03-18 ENCOUNTER — Ambulatory Visit (HOSPITAL_BASED_OUTPATIENT_CLINIC_OR_DEPARTMENT_OTHER): Payer: Self-pay | Attending: Internal Medicine

## 2013-03-18 DIAGNOSIS — I513 Intracardiac thrombosis, not elsewhere classified: Secondary | ICD-10-CM

## 2013-03-18 DIAGNOSIS — I4891 Unspecified atrial fibrillation: Secondary | ICD-10-CM

## 2013-03-18 LAB — PROTHROMBIN TIME
Prothrombin INR: 3 — ABNORMAL HIGH (ref 0.8–1.3)
Prothrombin Time Patient: 30.4 s — ABNORMAL HIGH (ref 10.7–15.6)

## 2013-03-18 LAB — DIGOXIN: Digoxin: 0.6 ng/mL (ref 0.5–1.9)

## 2013-03-18 NOTE — Telephone Encounter (Signed)
BLOOD DRAW LOCATION: [facility]    CALL FOR RESULTS: Self  Phone: [phone]    ANTICOAGULATION TREATMENT PLAN    Indication: A Fib (CHADS2=1, CHF)  Goal INR: 2-3  Duration of Therapy: Lifelong    Hemorrhagic Risk Score: 0  Warfarin Tablet Size: 5mg     Relevant Historic Information:     Referring Provider: Dr. Lucina Mellow [facility] cardiology, Dr. Carlynn Herald [phone]    SUBJECTIVE:   62 year old male on chronic anticoagulation for AFib.  He confirms taking his decreased Warfarin dose as listed below, denies any missed or extra doses.  Denies any s/sx of bleeding/bruising. On 03/03/13 he had started Amiodarone 200mg  TID x 4 weeks, then 200mg  daily thereafter.  Patient is also to be scheduled for cardioversion in January.  Will plan for 4 weekly INRs p/p cardioversion.    OBJECTIVE:   Present dose: Warfarin decreased on 03/03/13 to 2.5mg  TTSat and 5mg  MWFSun = 27.5mg /week (from 35mg /week)  Relevant medication changes: Amiodarone 200mg  TID x 4 weeks started on 03/03/13; then decrease to 200mg  daily thereafter (around 03/31/13).    LABS:   INR      3.0   03/18/2013  INR      2.4   03/11/2013  INR      3.3   03/03/2013     ASSESSMENT:   INR therapeutic, but trending back up.  Amiodarone will increase INR; will need to monitor closely in order to make timely adjustments and until new maintenance Warfarin dose established.  Also patient is to be cardioverted next month, so will plan for 4 weekly INRs p/p cardioversion.    PLAN:   1. Decrease Warfarin to 2.5mg  TTSS and 5mg  MWF = 25mg /week (another 9% decrease).   2. Re-check INR 03/25/2013.  3. Pt acknowledged understanding of this plan

## 2013-03-21 ENCOUNTER — Other Ambulatory Visit (HOSPITAL_BASED_OUTPATIENT_CLINIC_OR_DEPARTMENT_OTHER): Payer: Self-pay | Admitting: Pharmacist

## 2013-03-21 ENCOUNTER — Ambulatory Visit (HOSPITAL_BASED_OUTPATIENT_CLINIC_OR_DEPARTMENT_OTHER): Payer: Self-pay | Attending: Internal Medicine

## 2013-03-21 DIAGNOSIS — I4891 Unspecified atrial fibrillation: Secondary | ICD-10-CM

## 2013-03-21 DIAGNOSIS — Z79899 Other long term (current) drug therapy: Secondary | ICD-10-CM

## 2013-03-22 ENCOUNTER — Ambulatory Visit (HOSPITAL_BASED_OUTPATIENT_CLINIC_OR_DEPARTMENT_OTHER): Payer: Self-pay | Admitting: Pharmacist

## 2013-03-22 ENCOUNTER — Other Ambulatory Visit (HOSPITAL_BASED_OUTPATIENT_CLINIC_OR_DEPARTMENT_OTHER): Payer: Self-pay | Admitting: Pharmacist

## 2013-03-22 NOTE — Telephone Encounter (Signed)
Second attempt to contact pt regarding digoxin level and getting clarification on current metoprolol dose.     Per Park Meo ( Cardiology) on 03/03/13: plan was to load with amiodarone 200mg  TID x 1 month, cardiovert, then maintain with 200mg /day. Digoxin was to be lowered to 0.0625mg  daily, and metoprolol  ER to 50mg  BID.   Upon review of med dose changes on that date, pt. states he had already been taking 0.125mg  of digoxin as 1/2 tab daily for several months; also had been taking metoprolol as 2 tablets BID and his home med list that he carries with him indicates this is a 100mg  tablet, confirmed by his pharmacy. He was also dispensed a 50mg  strength mid-November as well so he is unsure what he has been taking.       Labs on 03/18/13: Dig: 0.6  01/31/13: K: 4.7  Baseline : LFTs, TSH wnl    Current Outpatient Prescriptions   Medication Sig   . Amiodarone HCl 200 MG Oral Tab Take 1 tablet (200 mg) by mouth 3 times a day.   . Digoxin 62.5 MCG Oral Tab Take 1 tablet (0.0625mg ) by mouth daily.   . Furosemide 40 MG Oral Tab 1 TABLET DAILY, BID   . Lisinopril 20 MG Oral Tab Take 1 tablet (20 mg) by mouth daily.   . Metoprolol Succinate ER 50 MG Oral TABLET SR 24 HR Take 1 tablet (50 mg) by mouth 2 times a day. Do not chew or crush. Need clarification on dose    . Warfarin Sodium 5 MG Oral Tab Take 1 tablet (5mg ) by mouth every day, or take as directed by [facility] Anticoagulation Clinic.       Today:  Left second message about Dig level wnl and to continue digoxin 0.0625mg  daily.  Asked pt to call back on current metoprolol dose.     Next  Cardiology appt on: 04/04/13

## 2013-03-25 ENCOUNTER — Other Ambulatory Visit (HOSPITAL_BASED_OUTPATIENT_CLINIC_OR_DEPARTMENT_OTHER): Payer: Self-pay | Admitting: Internal Medicine

## 2013-03-25 ENCOUNTER — Telehealth (HOSPITAL_BASED_OUTPATIENT_CLINIC_OR_DEPARTMENT_OTHER): Payer: No Typology Code available for payment source

## 2013-03-25 DIAGNOSIS — I4891 Unspecified atrial fibrillation: Secondary | ICD-10-CM

## 2013-03-25 DIAGNOSIS — I513 Intracardiac thrombosis, not elsewhere classified: Secondary | ICD-10-CM

## 2013-03-29 ENCOUNTER — Ambulatory Visit (HOSPITAL_BASED_OUTPATIENT_CLINIC_OR_DEPARTMENT_OTHER): Payer: No Typology Code available for payment source | Attending: Internal Medicine | Admitting: Pharmacist

## 2013-03-29 DIAGNOSIS — I4891 Unspecified atrial fibrillation: Secondary | ICD-10-CM | POA: Insufficient documentation

## 2013-03-29 DIAGNOSIS — Z7901 Long term (current) use of anticoagulants: Secondary | ICD-10-CM

## 2013-03-29 DIAGNOSIS — I5189 Other ill-defined heart diseases: Secondary | ICD-10-CM | POA: Insufficient documentation

## 2013-03-29 DIAGNOSIS — I513 Intracardiac thrombosis, not elsewhere classified: Secondary | ICD-10-CM

## 2013-03-29 DIAGNOSIS — Z5181 Encounter for therapeutic drug level monitoring: Secondary | ICD-10-CM

## 2013-03-29 LAB — PROTHROMBIN TIME
Prothrombin INR: 5.9 (ref 0.8–1.3)
Prothrombin Time Patient: 51.6 s — ABNORMAL HIGH (ref 10.7–15.6)

## 2013-03-29 NOTE — Telephone Encounter (Signed)
BLOOD DRAW LOCATION: [facility]    CALL FOR RESULTS: Self  Phone: [phone]    ANTICOAGULATION TREATMENT PLAN    Indication: A Fib (CHADS2=1, CHF)  Goal INR: 2-3  Duration of Therapy: Lifelong    Hemorrhagic Risk Score: 0  Warfarin Tablet Size: 5mg     Relevant Historic Information:     Referring Provider: Dr. Jabier Mutton [facility] cardiology, Dr. Jeri Modena [phone]    SUBJECTIVE:   63 year old male on chronic anticoagulation for AFib. He confirms taking his Warfarin dose as listed below, denies any missed or extra doses. Denies any s/sx of bleeding/bruising. On 03/03/13 he had started Amiodarone 200mg  TID x 4 weeks, then 200mg  daily thereafter. Patient states he had a some alcohol 2 nights ago. Patient is also to be scheduled for cardioversion on Monday January 12th. Will plan for 4 weekly INRs p/p cardioversion.      OBJECTIVE:   Present dose: 2.5mg  SatTueTh and 5mg  all other days  Relevant medication changes: amiodarone added 03/03/13      LABS:   INR      5.9   03/29/2013  INR      3.0   03/18/2013  INR      2.4   03/11/2013       ASSESSMENT:   supratherapeutic INR without any complications likely due to alcohol use.    PLAN:   1. Hold warfarin for 2 days then take 2.5mg  then return   2. Return in 3 days (on 04/01/2013).   3. Pt acknowledged understanding of this plan        [place]

## 2013-04-01 ENCOUNTER — Ambulatory Visit (HOSPITAL_BASED_OUTPATIENT_CLINIC_OR_DEPARTMENT_OTHER): Payer: No Typology Code available for payment source

## 2013-04-01 ENCOUNTER — Other Ambulatory Visit (HOSPITAL_BASED_OUTPATIENT_CLINIC_OR_DEPARTMENT_OTHER): Payer: No Typology Code available for payment source

## 2013-04-01 ENCOUNTER — Ambulatory Visit (HOSPITAL_BASED_OUTPATIENT_CLINIC_OR_DEPARTMENT_OTHER): Payer: No Typology Code available for payment source | Attending: Cardiovascular Disease | Admitting: Pharmacist

## 2013-04-01 ENCOUNTER — Other Ambulatory Visit (HOSPITAL_BASED_OUTPATIENT_CLINIC_OR_DEPARTMENT_OTHER): Payer: Self-pay | Admitting: Pharmacist

## 2013-04-01 DIAGNOSIS — I4892 Unspecified atrial flutter: Secondary | ICD-10-CM

## 2013-04-01 DIAGNOSIS — I4891 Unspecified atrial fibrillation: Secondary | ICD-10-CM

## 2013-04-01 DIAGNOSIS — I513 Intracardiac thrombosis, not elsewhere classified: Secondary | ICD-10-CM

## 2013-04-01 DIAGNOSIS — Z7901 Long term (current) use of anticoagulants: Secondary | ICD-10-CM

## 2013-04-01 DIAGNOSIS — Z5181 Encounter for therapeutic drug level monitoring: Secondary | ICD-10-CM | POA: Insufficient documentation

## 2013-04-01 DIAGNOSIS — I5189 Other ill-defined heart diseases: Secondary | ICD-10-CM | POA: Insufficient documentation

## 2013-04-01 LAB — PROTHROMBIN TIME
Prothrombin INR: 2.9 — ABNORMAL HIGH (ref 0.8–1.3)
Prothrombin Time Patient: 30 s — ABNORMAL HIGH (ref 10.7–15.6)

## 2013-04-01 NOTE — Progress Notes (Signed)
The patient to clinic for an EKG as ordered by Dr Jabier Mutton.      The patient had an echocardiogram this morning and told staff that he thought that he was back in a regular rhythm.  He is scheduled for DC cardioversion on Monday.      EKG done in clinic shows A Flutter with variable AV block and a ventricular rate of 55 bpm.  The EKG was forwarded to Dr. Jabier Mutton for review.      Per Dr. Jabier Mutton, we will proceed with DC cardioversion on 1/12 and will have the patient arrive to clinic at the appointed time.  The patient was instructed and agrees to follow this plan.

## 2013-04-01 NOTE — Telephone Encounter (Addendum)
BLOOD DRAW LOCATION: [facility]    CALL FOR RESULTS: Self  Phone: [phone]    ANTICOAGULATION TREATMENT PLAN    Indication: A Fib (CHADS2=1, CHF)  Goal INR: 2-3  Duration of Therapy: Lifelong    Hemorrhagic Risk Score: 0  Warfarin Tablet Size: 5mg     Relevant Historic Information:     Referring Provider: Dr. Jabier Mutton [facility] cardiology, Dr. Jeri Modena [phone]    SUBJECTIVE:   63 year old male on chronic anticoagulation for AFib. He confirms taking his Warfarin dose as listed below, denies any missed or extra doses. Denies any s/sx of bleeding/bruising. On 03/03/13 he had started Amiodarone 200mg  TID x 4 weeks then will take 200mg  daily after cardioversion. Patient drinks ETOH and has had elevations in INR in the past d/t this.  Patient is scheduled for cardioversion on Monday January 12th. Will plan for 4 weekly INRs p/p cardioversion.    OBJECTIVE:   Present dose: Hold x 2 days (1/6, 1/7) and 2.5mg  on 1/8.  Normal regimen: 2.5mg  SatTueTh and 5mg  all other days  Relevant medication changes: amiodarone added 03/03/13    LABS:   INR      2.9   04/01/2013  INR      5.9   03/29/2013  INR      3.0   03/18/2013     ASSESSMENT:   Therapeutic INR d/t holding doses.  Anticipate INR may also decrease once amiodarone dose is decreased from 200mg  TID (600mg ) to 200mg  once daily post cardioversion.    PLAN:   1. Take warfarin 5mg  x 3 days (1,9, 1/10, 1/11) and then resume normal regimen 2.5mg  SatTuesThur and 5mg  ROW.   2. Cardioversion 04/04/13.  3. Repeat INR 04/07/13.  4. Pt acknowledged understanding of this plan.      Wynell Balloon, PharmD, PGY-2 Ambulatory Care Resident    ---------------------------------------------------------------  Attending: Coralee Rud, PharmD  I discussed this patient with the resident.  I have reviewed and confirm the findings, the assessment and the plan as documented in the resident's note .  ---------------------------------------------------------------

## 2013-04-04 ENCOUNTER — Other Ambulatory Visit (HOSPITAL_COMMUNITY): Payer: Self-pay | Admitting: Internal Medicine

## 2013-04-04 ENCOUNTER — Encounter (HOSPITAL_BASED_OUTPATIENT_CLINIC_OR_DEPARTMENT_OTHER): Payer: Self-pay | Admitting: Internal Medicine

## 2013-04-04 ENCOUNTER — Ambulatory Visit (HOSPITAL_BASED_OUTPATIENT_CLINIC_OR_DEPARTMENT_OTHER): Payer: No Typology Code available for payment source

## 2013-04-04 ENCOUNTER — Ambulatory Visit (HOSPITAL_COMMUNITY): Payer: No Typology Code available for payment source | Admitting: Internal Medicine

## 2013-04-04 ENCOUNTER — Ambulatory Visit (HOSPITAL_BASED_OUTPATIENT_CLINIC_OR_DEPARTMENT_OTHER)
Admission: RE | Admit: 2013-04-04 | Discharge: 2013-04-04 | Disposition: A | Discharge status: Home / Self Care | Payer: No Typology Code available for payment source | Attending: Internal Medicine | Admitting: Internal Medicine

## 2013-04-04 ENCOUNTER — Other Ambulatory Visit (HOSPITAL_BASED_OUTPATIENT_CLINIC_OR_DEPARTMENT_OTHER): Payer: Self-pay | Admitting: Internal Medicine

## 2013-04-04 DIAGNOSIS — Z538 Procedure and treatment not carried out for other reasons: Secondary | ICD-10-CM | POA: Insufficient documentation

## 2013-04-04 DIAGNOSIS — I4891 Unspecified atrial fibrillation: Secondary | ICD-10-CM

## 2013-04-04 DIAGNOSIS — I5020 Unspecified systolic (congestive) heart failure: Secondary | ICD-10-CM

## 2013-04-04 LAB — BASIC METABOLIC PANEL
Anion Gap: 3 (ref 3–11)
Calcium: 9.5 mg/dL (ref 8.9–10.2)
Carbon Dioxide, Total: 33 mEq/L — ABNORMAL HIGH (ref 22–32)
Chloride: 101 mEq/L (ref 98–108)
Creatinine: 2.22 mg/dL — ABNORMAL HIGH (ref 0.51–1.18)
GFR, Calc, African American: 36 mL/min — ABNORMAL LOW (ref >59)
GFR, Calc, European American: 30 mL/min — ABNORMAL LOW (ref >59)
Glucose: 110 mg/dL (ref 62–125)
Potassium: 4.8 mEq/L (ref 3.7–5.2)
Sodium: 137 mEq/L (ref 136–145)
Urea Nitrogen: 41 mg/dL — ABNORMAL HIGH (ref 8–21)

## 2013-04-04 MED ORDER — AMIODARONE HCL 200 MG OR TABS
200.0000 mg | ORAL_TABLET | Freq: Two times a day (BID) | ORAL | Status: DC
Start: 2013-04-04 — End: 2014-05-04

## 2013-04-04 MED ORDER — METOPROLOL SUCCINATE ER 100 MG OR TB24
100.0000 mg | EXTENDED_RELEASE_TABLET | Freq: Every day | ORAL | Status: DC
Start: 2013-04-04 — End: 2013-05-26

## 2013-04-04 NOTE — Progress Notes (Signed)
Saw patient in [facility] pre- his planned DCCV for chronic afib. Echo on Friday showed improved LV systolic function to EF 81% but was in 4:1 atrial flutter. ECG today shows sinus bradycardia with rate of 40. Patient feels very well, no cardiac sx, and exam notable only for regular bradycardia with old systolic murmur. C7 pending but prior suggested volume contraction.    A: Chronic paroxysmal fib/flutter, NYHA class 1, now in sinus bradycardia. Therapeutic anticoagulation.    P: No need for DCCV today.    1. Stop digoxin.  2. Change metoprolol XL to 100 mg each morning only.  3. Decrease amiodarone to 200 mg twice daily.

## 2013-04-07 ENCOUNTER — Telehealth (HOSPITAL_BASED_OUTPATIENT_CLINIC_OR_DEPARTMENT_OTHER): Payer: No Typology Code available for payment source

## 2013-04-07 ENCOUNTER — Other Ambulatory Visit (HOSPITAL_BASED_OUTPATIENT_CLINIC_OR_DEPARTMENT_OTHER): Payer: Self-pay | Admitting: Pharmacist

## 2013-04-07 DIAGNOSIS — I513 Intracardiac thrombosis, not elsewhere classified: Secondary | ICD-10-CM

## 2013-04-07 DIAGNOSIS — Z7901 Long term (current) use of anticoagulants: Secondary | ICD-10-CM

## 2013-04-07 DIAGNOSIS — Z5181 Encounter for therapeutic drug level monitoring: Secondary | ICD-10-CM

## 2013-04-07 DIAGNOSIS — I4891 Unspecified atrial fibrillation: Secondary | ICD-10-CM

## 2013-04-13 ENCOUNTER — Encounter (HOSPITAL_BASED_OUTPATIENT_CLINIC_OR_DEPARTMENT_OTHER): Payer: Self-pay

## 2013-04-15 ENCOUNTER — Encounter (HOSPITAL_BASED_OUTPATIENT_CLINIC_OR_DEPARTMENT_OTHER): Payer: Self-pay

## 2013-04-21 ENCOUNTER — Telehealth (HOSPITAL_BASED_OUTPATIENT_CLINIC_OR_DEPARTMENT_OTHER): Payer: Self-pay

## 2013-04-21 NOTE — Telephone Encounter (Signed)
Left vmail to r/s 04/07/13 phone appt/INR

## 2013-04-28 ENCOUNTER — Telehealth (HOSPITAL_BASED_OUTPATIENT_CLINIC_OR_DEPARTMENT_OTHER): Payer: Self-pay

## 2013-04-28 NOTE — Telephone Encounter (Signed)
LM FOR PT TO CALL FOR APT

## 2013-05-10 ENCOUNTER — Encounter (HOSPITAL_BASED_OUTPATIENT_CLINIC_OR_DEPARTMENT_OTHER): Payer: Self-pay

## 2013-05-16 ENCOUNTER — Other Ambulatory Visit (HOSPITAL_BASED_OUTPATIENT_CLINIC_OR_DEPARTMENT_OTHER): Payer: Self-pay

## 2013-05-16 DIAGNOSIS — I509 Heart failure, unspecified: Secondary | ICD-10-CM

## 2013-05-23 ENCOUNTER — Other Ambulatory Visit (HOSPITAL_BASED_OUTPATIENT_CLINIC_OR_DEPARTMENT_OTHER): Payer: Self-pay

## 2013-05-23 DIAGNOSIS — I509 Heart failure, unspecified: Secondary | ICD-10-CM

## 2013-05-23 DIAGNOSIS — I4891 Unspecified atrial fibrillation: Secondary | ICD-10-CM

## 2013-05-26 ENCOUNTER — Telehealth (HOSPITAL_BASED_OUTPATIENT_CLINIC_OR_DEPARTMENT_OTHER): Payer: No Typology Code available for payment source

## 2013-05-26 ENCOUNTER — Ambulatory Visit (HOSPITAL_BASED_OUTPATIENT_CLINIC_OR_DEPARTMENT_OTHER): Payer: No Typology Code available for payment source | Attending: Internal Medicine | Admitting: Internal Medicine

## 2013-05-26 VITALS — BP 132/71 | HR 46 | Temp 97.2°F | Ht 71.0 in | Wt 188.2 lb

## 2013-05-26 DIAGNOSIS — I4891 Unspecified atrial fibrillation: Secondary | ICD-10-CM | POA: Insufficient documentation

## 2013-05-26 DIAGNOSIS — I5020 Unspecified systolic (congestive) heart failure: Secondary | ICD-10-CM

## 2013-05-26 DIAGNOSIS — I509 Heart failure, unspecified: Secondary | ICD-10-CM

## 2013-05-26 LAB — BASIC METABOLIC PANEL
Anion Gap: 6 (ref 4–12)
Calcium: 9.2 mg/dL (ref 8.9–10.2)
Carbon Dioxide, Total: 30 mEq/L (ref 22–32)
Chloride: 104 mEq/L (ref 98–108)
Creatinine: 1.55 mg/dL — ABNORMAL HIGH (ref 0.51–1.18)
GFR, Calc, African American: 55 mL/min — ABNORMAL LOW (ref >59)
GFR, Calc, European American: 45 mL/min — ABNORMAL LOW (ref >59)
Glucose: 79 mg/dL (ref 62–125)
Potassium: 4.2 mEq/L (ref 3.6–5.2)
Sodium: 140 mEq/L (ref 135–145)
Urea Nitrogen: 25 mg/dL — ABNORMAL HIGH (ref 8–21)

## 2013-05-26 LAB — PROTHROMBIN TIME
Prothrombin INR: 4 — ABNORMAL HIGH (ref 0.8–1.3)
Prothrombin Time Patient: 38 s — ABNORMAL HIGH (ref 10.7–15.6)

## 2013-05-26 LAB — CBC (HEMOGRAM)
Hematocrit: 37 % — ABNORMAL LOW (ref 38–50)
Hemoglobin: 12.4 g/dL — ABNORMAL LOW (ref 13.0–18.0)
MCH: 33.8 pg — ABNORMAL HIGH (ref 27.3–33.6)
MCHC: 33.5 g/dL (ref 32.2–36.5)
MCV: 101 fL — ABNORMAL HIGH (ref 81–98)
Platelet Count: 188 10*3/uL (ref 150–400)
RBC: 3.67 mil/uL — ABNORMAL LOW (ref 4.40–5.60)
RDW-CV: 14.5 % — ABNORMAL HIGH (ref 11.6–14.4)
WBC: 6.17 10*3/uL (ref 4.30–10.00)

## 2013-05-26 LAB — MAGNESIUM: Magnesium: 2.1 mg/dL (ref 1.8–2.4)

## 2013-05-26 LAB — DIGOXIN: Digoxin: 0.2 ng/mL — ABNORMAL LOW (ref 0.5–1.9)

## 2013-05-26 LAB — PARTIAL THROMBOPLASTIN TIME: Partial Thromboplastin Time: 47 s — ABNORMAL HIGH (ref 22–35)

## 2013-05-26 LAB — B_TYPE NATRIURETIC PEPTIDE

## 2013-05-26 MED ORDER — METOPROLOL SUCCINATE ER 50 MG OR TB24
50.0000 mg | EXTENDED_RELEASE_TABLET | Freq: Every day | ORAL | Status: DC
Start: 2013-05-26 — End: 2014-05-31

## 2013-05-26 MED ORDER — RIVAROXABAN 20 MG OR TABS
20.0000 mg | ORAL_TABLET | Freq: Every day | ORAL | Status: DC
Start: 2013-05-26 — End: 2015-04-17

## 2013-05-26 NOTE — Progress Notes (Signed)
OUTPATIENT VISIT    ID/CHIEF COMPLAINT: Mark Poole is a 63 year old male here for Heart Problem      HISTORY:  63 year old male returns for follow-up of his atrial fibrillation and heart failure with reduced ejection fraction.  He feels well and denies chest pain shortness of breath palpitations presyncope syncope or lower extremity edema.  He is adherent with his medications.  He would like to switch to a new generation anticoagulant because he would like to stop seeing anticoagulant clinic.  He wonders what the cost will be.    1-2 drinks of alcohol weekly.    REVIEW OF SYSTEMS:  A complete ROS was performed and is negative, except where noted in the above HPI.    PHYSICAL EXAM:  VITALS - Blood pressure 132/71, pulse 46, temperature 97.2 F (36.2 C), temperature source Temporal, height 5\' 11"  (1.803 m), weight 188 lb 3.2 oz (85.367 kg), SpO2 98 %.  GENERAL - well appearing no acute distress  HEENT - anicteric mucous memory is moist  RESPIRATORY - clear to auscultation bilaterally  CARDIOVASCULAR - slow rate and normal rhythm with normal S1-S2 with a 3/6 systolic murmur loudest at the left sternal border and no diastolic murmur.  No rub or gallop.  No lower extremity edema.  No heave with normal PMI.  no carotid or abdominal bruits.  ABDOMEN - soft nontender  SKIN - no edema  NEURO - moving all extremities equally, face symmetric  MENTAL STATUS - alert and oriented    DIAGNOSTIC STUDIES:  ECG sinus brady rate 46 bpm  Creatinine 1.5 down from 2.2 in January, sodium 140, potassium 4.2, hematocrit 37, INR 4.0    ASSESSMENT/PLAN:  Mark Poole was seen today for heart failure with reduced ejection fraction and atrial fibrillation.  He is doing well and on most recent echocardiogram his systolic function had almost normalized to 55% ejection fraction.  He has no cardiac symptoms and is adherent on his medications including amiodarone.  He is slightly super therapeutic on his Coumadin today.  He is in sinus  bradycardia.    Today we will  -Decrease metoprolol to 50 mg daily given his sinus bradycardia  -Continue amiodarone at current dose as it is providing excellent pharmacologic rhythm control  -Prescribed Roxanol and 20 mg daily to replace warfarin with transition as instructed-he will check co-pay and let us know  -He will establish with a primary care doctor  -We will plan for follow-up PFTs and TFTs this summer and then every 6 months after that    Diagnoses and associated orders for this visit:    Atrial fibrillation  - ELECTROCARDIOGRAM REPORT ONLY [93010]  - ELECTROCARDIOGRAM TRACING ONLY [93005]  - Rivaroxaban 20 MG Oral Tab; Take 1 tablet (20 mg) by mouth daily. Start 5 days after stopping warfarin  - Metoprolol Succinate ER 50 MG Oral TABLET SR 24 HR; Take 1 tablet (50 mg) by mouth daily. Do not chew or crush.  - REFERRAL TO PFT DIAGNOSTIC LAB    HFrEF (heart failure with reduced ejection fraction)  - ELECTROCARDIOGRAM REPORT ONLY [93010]  - ELECTROCARDIOGRAM TRACING ONLY [93005]  - Rivaroxaban 20 MG Oral Tab; Take 1 tablet (20 mg) by mouth daily. Start 5 days after stopping warfarin  - Metoprolol Succinate ER 50 MG Oral TABLET SR 24 HR; Take 1 tablet (50 mg) by mouth daily. Do not chew or crush.  - REFERRAL TO PFT DIAGNOSTIC LAB  Return in about 6 months (around 11/26/2013).        ------------------------------------------------------------------------------------------------------------------  SUPPLEMENTAL INFO:    PROBLEM LIST:  Patient Active Problem List    Diagnosis Date Noted   . NHL (non-Hodgkin's lymphoma) 03/03/2013     In lungs  Radiation therapy     . Chronic anticoagulation 02/02/2013     [facility] enrolled 08/19/12     . 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 HFrEF (heart failure with reduced ejection fraction) 09/03/2012     07/08/12 TTE: EF 36%.Left ventricle is normal in size and thickness. Moderate global hypokinesis. Right ventricle is normal in size with moderate systolic  dysfunction. Mild-moderate tricuspid regurgitation: estimated pulmonary artery systolic pressures are normal (29-34 mmHg).  Moderate mitral regurgitation.Trileaflet aortic valve with mild valvular stenosis and trace regurgitation.No pericardial effusion       . Left atrial thrombus 08/30/2012     08/30/12. TEE. Small thrombus in the left atrial appendage. A bright immobile structure in the appendage is again visualized, possibly representing a pectinate muscle vs calcified thrombus. Some views show an aspect of this mass that appears consistent with mobile thrombus. There is spontaneous echo contrast in the appendage and left atrium. Cardioversion was thus deferred.          . Atrial fibrillation 08/19/2012        SOCIAL HISTORY:  History     Social History   . Marital Status: Single     Spouse Name: N/A     Number of Children: N/A   . Years of Education: N/A     Occupational History   . Not on file.     Social History Main Topics   . Smoking status: Former Smoker -- 1.00 packs/day for 20 years     Types: Cigarettes   . Smokeless tobacco: Not on file   . Alcohol Use: No   . Drug Use: No   . Sexual Activity: Not on file     Other Topics Concern   . Not on file     Social History Narrative   . No narrative on file       CURRENT MEDICATIONS:  as of end of visit on 05/26/2013    Current Outpatient Prescriptions   Medication Sig Dispense Refill   . Amiodarone HCl 200 MG Oral Tab Take 1 tablet (200 mg) by mouth 2 times a day.  65 tablet  11   . Furosemide 20 MG Oral Tab Take 1 tablet (20 mg) by mouth daily.  90 tablet  3   . Lisinopril 20 MG Oral Tab Take 1 tablet (20 mg) by mouth daily.  90 tablet  0   . Metoprolol Succinate ER 50 MG Oral TABLET SR 24 HR Take 1 tablet (50 mg) by mouth daily. Do not chew or crush.  90 tablet  11   . Rivaroxaban 20 MG Oral Tab Take 1 tablet (20 mg) by mouth daily. Start 5 days after stopping warfarin  90 tablet  11     No current facility-administered medications for this visit.        ALLERGIES:  Review of patient's allergies indicates:  No Known Allergies     VITAL SIGN TRENDS:       Wt Readings from Last 5 Encounters:   05/26/13 188 lb 3.2 oz (85.367 kg)   03/03/13 189 lb (85.73 kg)   02/07/13 188 lb 7.9 oz (85.5 kg)   01/31/13 186 lb 1.1 oz (84.4  kg)   01/20/13 186 lb (84.369 kg)          BP Readings from Last 5 Encounters:   05/26/13 132/71   03/03/13 120/88   02/07/13 111/68   01/31/13 112/65   01/20/13 126/88        STUDIES:  Orders Only on 05/23/2013   Component Date Value Range Status   . B_Type Natriuretic Peptide 28/41/3244 Duplicate request  <010 pg/mL Final   . Sodium 27/25/3664 Duplicate request  403 - 145 mEq/L Final   . Potassium 47/42/5956 Duplicate request  3.6 - 5.2 mEq/L Final   . Chloride 38/75/6433 Duplicate request  98 - 108 mEq/L Final   . Carbon Dioxide, Total 29/51/8841 Duplicate request  22 - 32 mEq/L Final   . Anion Gap 66/08/3014 Duplicate request  4 - 12 Final   . Glucose 04/01/3233 Duplicate request  62 - 125 mg/dL Final   . Urea Nitrogen 57/32/2025 Duplicate request  8 - 21 mg/dL Final   . Creatinine 42/70/6237 Duplicate request  6.28 - 1.18 mg/dL Final   . Calcium 31/51/7616 Duplicate request  8.9 - 10.2 mg/dL Final   . Gfr, Calc, European American 07/37/1062 Duplicate request  >69 mL/min Final   . GFR, Calc, African American 48/54/6270 Duplicate request  >35 mL/min Final   . GFR, Information 00/93/8182 Duplicate request   Final   . Prothrombin Time Patient 05/26/2013 38.0* 10.7 - 15.6 s Final   . Prothrombin INR 05/26/2013 4.0* 0.8 - 1.3 Final

## 2013-05-26 NOTE — Patient Instructions (Addendum)
1. Decrease metoprolol to 50 mg once daily  2. Continue other medications including furosemide 20 mg once daily  2. Prescribed Rivaraoxaban instead of warfarin.   - stop warfarin 5 days prior to starting rivaroxaban  3. Plan on lung and thyroid tests this summer  4. Do not take digoxin  5. Establish care with a primary care doctor

## 2013-05-27 ENCOUNTER — Telehealth (HOSPITAL_BASED_OUTPATIENT_CLINIC_OR_DEPARTMENT_OTHER): Payer: No Typology Code available for payment source

## 2013-05-30 ENCOUNTER — Telehealth (HOSPITAL_BASED_OUTPATIENT_CLINIC_OR_DEPARTMENT_OTHER): Payer: Self-pay | Admitting: Registered Nurse

## 2013-05-30 ENCOUNTER — Telehealth (HOSPITAL_BASED_OUTPATIENT_CLINIC_OR_DEPARTMENT_OTHER): Payer: No Typology Code available for payment source

## 2013-05-30 NOTE — Telephone Encounter (Signed)
I left a message for the patient letting him know that his insurance declined to pay for Xeralto.  I asked that he call with any questions.      Dr. Jabier Mutton was notified.

## 2013-05-31 ENCOUNTER — Telehealth (HOSPITAL_BASED_OUTPATIENT_CLINIC_OR_DEPARTMENT_OTHER): Payer: No Typology Code available for payment source

## 2013-06-01 ENCOUNTER — Telehealth (HOSPITAL_BASED_OUTPATIENT_CLINIC_OR_DEPARTMENT_OTHER): Payer: No Typology Code available for payment source

## 2013-06-02 ENCOUNTER — Telehealth (HOSPITAL_BASED_OUTPATIENT_CLINIC_OR_DEPARTMENT_OTHER): Payer: No Typology Code available for payment source

## 2013-06-03 ENCOUNTER — Telehealth (HOSPITAL_BASED_OUTPATIENT_CLINIC_OR_DEPARTMENT_OTHER): Payer: No Typology Code available for payment source

## 2013-06-06 ENCOUNTER — Telehealth (HOSPITAL_BASED_OUTPATIENT_CLINIC_OR_DEPARTMENT_OTHER): Payer: No Typology Code available for payment source

## 2013-06-07 ENCOUNTER — Telehealth (HOSPITAL_BASED_OUTPATIENT_CLINIC_OR_DEPARTMENT_OTHER): Payer: Self-pay | Admitting: Pharmacist

## 2013-06-07 ENCOUNTER — Telehealth (HOSPITAL_BASED_OUTPATIENT_CLINIC_OR_DEPARTMENT_OTHER): Payer: No Typology Code available for payment source

## 2013-06-07 DIAGNOSIS — I4891 Unspecified atrial fibrillation: Secondary | ICD-10-CM

## 2013-06-07 DIAGNOSIS — I513 Intracardiac thrombosis, not elsewhere classified: Secondary | ICD-10-CM

## 2013-06-07 NOTE — Telephone Encounter (Signed)
Left message for pt for anticoagulation f/up in light of insurance coverage for rivaroxaban.  Pt's last INR was 4.0 05/26/13, asked to hold warfarin x1, but have not been able to reach this pt.  No further appts scheduled.   Will mail letter requesting call back to [facility] Anticoagulation clinic

## 2013-06-07 NOTE — Telephone Encounter (Signed)
Have left messages for pt to call the [facility] Anticoagulation clinic for follow up.Last INR was 4.0 May 26, 2013

## 2013-06-08 ENCOUNTER — Telehealth (HOSPITAL_BASED_OUTPATIENT_CLINIC_OR_DEPARTMENT_OTHER): Payer: No Typology Code available for payment source

## 2013-06-08 NOTE — Telephone Encounter (Signed)
Called patient and left message to call clinic back - will continue to try to contact.

## 2013-06-14 ENCOUNTER — Telehealth (HOSPITAL_BASED_OUTPATIENT_CLINIC_OR_DEPARTMENT_OTHER): Payer: Self-pay

## 2013-06-14 NOTE — Telephone Encounter (Signed)
Could you have Mark Poole get his HR checked. He was sinus brady at 46 when I saw him last and I dropped his BB dose. Just want to make sure it is >60. An ECG would be best but he could also get a pulse check at PCP or fire station if he wants.    Called Mark Poole, he will go to a fire station and get HR checked. Will call if HR is below 60 bpm.

## 2013-06-16 ENCOUNTER — Telehealth (HOSPITAL_BASED_OUTPATIENT_CLINIC_OR_DEPARTMENT_OTHER): Payer: Self-pay

## 2013-06-17 ENCOUNTER — Telehealth (HOSPITAL_BASED_OUTPATIENT_CLINIC_OR_DEPARTMENT_OTHER): Payer: Self-pay

## 2013-06-17 ENCOUNTER — Encounter (HOSPITAL_BASED_OUTPATIENT_CLINIC_OR_DEPARTMENT_OTHER): Payer: Self-pay

## 2013-06-24 ENCOUNTER — Encounter (HOSPITAL_BASED_OUTPATIENT_CLINIC_OR_DEPARTMENT_OTHER): Payer: Self-pay

## 2013-06-24 ENCOUNTER — Telehealth (HOSPITAL_BASED_OUTPATIENT_CLINIC_OR_DEPARTMENT_OTHER): Payer: Self-pay

## 2013-06-29 NOTE — Telephone Encounter (Signed)
Called and left message for patient to re-schedule INR follow-up.

## 2013-07-03 ENCOUNTER — Other Ambulatory Visit (HOSPITAL_BASED_OUTPATIENT_CLINIC_OR_DEPARTMENT_OTHER): Payer: Self-pay | Admitting: Internal Medicine

## 2013-07-04 ENCOUNTER — Encounter (HOSPITAL_BASED_OUTPATIENT_CLINIC_OR_DEPARTMENT_OTHER): Payer: Self-pay

## 2013-07-09 ENCOUNTER — Other Ambulatory Visit (HOSPITAL_BASED_OUTPATIENT_CLINIC_OR_DEPARTMENT_OTHER): Payer: Self-pay | Admitting: Internal Medicine

## 2013-07-09 DIAGNOSIS — I509 Heart failure, unspecified: Secondary | ICD-10-CM

## 2013-07-11 MED ORDER — FUROSEMIDE 20 MG OR TABS
20.0000 mg | ORAL_TABLET | Freq: Every day | ORAL | Status: DC
Start: 2013-07-11 — End: 2013-10-07

## 2013-07-27 ENCOUNTER — Telehealth (HOSPITAL_BASED_OUTPATIENT_CLINIC_OR_DEPARTMENT_OTHER): Payer: Self-pay

## 2013-07-27 NOTE — Telephone Encounter (Signed)
Left message for pt to call back for further instructions from Dr. Jabier Mutton regarding BP

## 2013-07-27 NOTE — Telephone Encounter (Signed)
Informed pt about Dr. April Manson instructions for taking BP & HR 1-2x daily for x1 week and to call back with that information. Educated pt on taking BP at same time each day, while sitting without legs crossed, arm relaxed at heart level.

## 2013-07-27 NOTE — Telephone Encounter (Signed)
Pt called to follow up with Anda Kraft about BP/HR. Pt reports BP taken at dentist's office, which was 179/95.  Asked pt if he knew his HR from this encounter as well, which he did not.  Pt does have home BP machine, so I asked pt to take BP/HR with that, which read 156/86, HR 59.   Notified Seth Bake, RN, who contacted Dr. Jabier Mutton with above information. Dr. Jabier Mutton requested pt take BP at home for a week and call back with more data. Will call pt to let him know.

## 2013-08-02 ENCOUNTER — Encounter (HOSPITAL_BASED_OUTPATIENT_CLINIC_OR_DEPARTMENT_OTHER): Payer: Self-pay

## 2013-08-27 ENCOUNTER — Other Ambulatory Visit: Payer: Self-pay | Admitting: Internal Medicine

## 2013-08-27 DIAGNOSIS — I5020 Unspecified systolic (congestive) heart failure: Secondary | ICD-10-CM

## 2013-08-29 MED ORDER — LISINOPRIL 20 MG OR TABS
ORAL_TABLET | ORAL | Status: DC
Start: 2013-08-29 — End: 2013-11-30

## 2013-09-05 ENCOUNTER — Encounter (HOSPITAL_BASED_OUTPATIENT_CLINIC_OR_DEPARTMENT_OTHER): Payer: Self-pay | Admitting: Pharmacist

## 2013-09-05 NOTE — Progress Notes (Signed)
Mark Poole is no longer taking warfarin, he has switched to rivaroxiban. We will discharge him from our sevice at this time.

## 2013-10-07 ENCOUNTER — Other Ambulatory Visit (HOSPITAL_BASED_OUTPATIENT_CLINIC_OR_DEPARTMENT_OTHER): Payer: Self-pay | Admitting: Internal Medicine

## 2013-10-07 DIAGNOSIS — I5020 Unspecified systolic (congestive) heart failure: Secondary | ICD-10-CM

## 2013-10-07 MED ORDER — FUROSEMIDE 20 MG OR TABS
ORAL_TABLET | ORAL | Status: DC
Start: 2013-10-07 — End: 2014-01-03

## 2013-11-30 ENCOUNTER — Other Ambulatory Visit: Payer: Self-pay | Admitting: Internal Medicine

## 2013-11-30 ENCOUNTER — Telehealth (HOSPITAL_BASED_OUTPATIENT_CLINIC_OR_DEPARTMENT_OTHER): Payer: Self-pay | Admitting: Internal Medicine

## 2013-11-30 DIAGNOSIS — I5020 Unspecified systolic (congestive) heart failure: Secondary | ICD-10-CM

## 2013-11-30 NOTE — Telephone Encounter (Signed)
CONFIRMED PHONE NUMBER: [phone]  CALLERS FIRST AND LAST NAME: Marisue Brooklyn  FACILITY NAME: na TITLE: na  CALLERS RELATIONSHIP:Self  RETURN CALL: Detailed message on voicemail only     SUBJECT: Appointment Request   REASON FOR REQUEST: Patient had to cancel 9/10 appointment, something came up for his wife's care, and he has to take her to an appointment at that time. [facility] unable to schedule patient, no available appointments in template release date. Please call patient to reschedule. Thank you.     REQUEST APPOINTMENT WITH: Donavan Foil   REFERRING PROVIDER: return patient   REQUESTED DATE: any   REQUESTED TIME: any   UNABLE TO APPOINT: Schedule unavailable

## 2013-11-30 NOTE — Telephone Encounter (Signed)
Left message for pt letting him know that we will contact him for rescheduling once Dr April Manson Marcene Duos schedule opens up

## 2013-12-01 ENCOUNTER — Encounter (HOSPITAL_BASED_OUTPATIENT_CLINIC_OR_DEPARTMENT_OTHER): Payer: No Typology Code available for payment source | Admitting: Internal Medicine

## 2013-12-01 MED ORDER — LISINOPRIL 20 MG OR TABS
20.0000 mg | ORAL_TABLET | Freq: Every day | ORAL | Status: DC
Start: 2013-12-01 — End: 2014-01-03

## 2014-01-03 ENCOUNTER — Other Ambulatory Visit (HOSPITAL_BASED_OUTPATIENT_CLINIC_OR_DEPARTMENT_OTHER): Payer: Self-pay | Admitting: Internal Medicine

## 2014-01-03 DIAGNOSIS — I5020 Unspecified systolic (congestive) heart failure: Secondary | ICD-10-CM

## 2014-01-03 MED ORDER — LISINOPRIL 20 MG OR TABS
20.0000 mg | ORAL_TABLET | Freq: Every day | ORAL | Status: DC
Start: 2014-01-03 — End: 2014-03-02

## 2014-01-03 MED ORDER — FUROSEMIDE 20 MG OR TABS
20.0000 mg | ORAL_TABLET | Freq: Every day | ORAL | Status: DC
Start: 2014-01-03 — End: 2014-03-02

## 2014-01-03 NOTE — Telephone Encounter (Signed)
Patient last seen on 05/26/13 for A. Fib, heart failure with reduced ejection fraction and was to return in 6 months.  One refill authorized.  Please schedule follow up visit.

## 2014-01-04 NOTE — Telephone Encounter (Signed)
Left message for pt to call for scheduling

## 2014-01-12 ENCOUNTER — Other Ambulatory Visit: Payer: Self-pay

## 2014-02-22 ENCOUNTER — Other Ambulatory Visit (HOSPITAL_BASED_OUTPATIENT_CLINIC_OR_DEPARTMENT_OTHER): Payer: Self-pay | Admitting: Internal Medicine

## 2014-02-23 ENCOUNTER — Telehealth (HOSPITAL_BASED_OUTPATIENT_CLINIC_OR_DEPARTMENT_OTHER): Payer: Self-pay | Admitting: Pharmacotherapy

## 2014-02-23 NOTE — Telephone Encounter (Signed)
Clarification needed prior to refill approval or denial:    Warfarin requires your authorization because it was discontinued off medication list on 05/26/2013 but patient is now requesting a refill.    Reason for discontinuation: Prescribed Rivaroxaban 20 MG Oral Tab daily to replace warfarin     #30 warfarin last filled 01/19/2014 (it appears rx was not stopped)    If medication is DISCONTINUED please "refuse all" medication requests and have your staff contact the patient.  If medication is to be continued please add refills, sign order and close encounter.

## 2014-02-23 NOTE — Telephone Encounter (Signed)
He's on rivaroxaban so warfarin should not be dispensed.    Anda Kraft can you contact him and med recon/clarify with him?    Thanks,    Marya Amsler

## 2014-02-23 NOTE — Telephone Encounter (Signed)
Received refill request for warfarin. Per records, pt was seen by Dr. Jabier Mutton on 05/26/13 and requested switching to a NOAC as he did no longer wanted to follow with Anticoagulation Clinic. He was given a prescription for rivaroxaban 20mg  daily at that time. RN note on 05/30/13 indicates this was denied by his insurance. The Anticoagulation Clinic staff subsequently attempted to reach him several times by phone and mail to continue care. He was discharged in June, although it appears the thought was that he was on rivaroxaban at that time.      According to the data on file with with his pharmacy, Mark Poole has been refilling warfarin 5mg  tablets every 4 to 6 weeks throughout this year. His last INR on record was 4.0 on 05/26/13. He never picked up any rivaroxaban. His pharmacist at [facility] tried to run this prescription again today and it was denied. We will pursue prior authorization one more time. If rivaroxaban is again denied, Mark Poole will need to resume monitoring with the Anticoagulation Clinic or we cannot continue to prescribe warfarin.     Left message for pt requesting he contact clinic so this can be explained to him.    Malen Gauze, PharmD, BCPS

## 2014-02-24 ENCOUNTER — Encounter (HOSPITAL_BASED_OUTPATIENT_CLINIC_OR_DEPARTMENT_OTHER): Payer: Self-pay | Admitting: Registered Nurse

## 2014-02-24 ENCOUNTER — Telehealth (HOSPITAL_BASED_OUTPATIENT_CLINIC_OR_DEPARTMENT_OTHER): Payer: Self-pay | Admitting: Pharmacist

## 2014-02-24 DIAGNOSIS — I4891 Unspecified atrial fibrillation: Secondary | ICD-10-CM

## 2014-02-24 DIAGNOSIS — I513 Intracardiac thrombosis, not elsewhere classified: Secondary | ICD-10-CM

## 2014-02-24 NOTE — Progress Notes (Signed)
PA approval for Xarelto for 01/25/14 - 02/24/15 attached.

## 2014-02-24 NOTE — Telephone Encounter (Signed)
[facility] received message from patient and [facility].  Per notes, it appears patient's Rivaroxaban was denied by his insurance, and he was continuing to take Warfarin without monitoring.  Per notes, [facility] is pursuing another prior authorization to try to get patient's Rivaroxaban prescription filled.  If denied again, patient will need to resume monitoring with [facility] on Warfarin.    Called patient back at [phone] and left message for him to contact us to review this information with him.

## 2014-02-27 ENCOUNTER — Telehealth (HOSPITAL_BASED_OUTPATIENT_CLINIC_OR_DEPARTMENT_OTHER): Payer: Self-pay

## 2014-02-27 NOTE — Telephone Encounter (Signed)
Patient was called and he has stopped his warfarin yesterday and will wait for 5 days til starting xeralto. He knows to come to clinic for MD app on the 10th.

## 2014-03-02 ENCOUNTER — Encounter (HOSPITAL_BASED_OUTPATIENT_CLINIC_OR_DEPARTMENT_OTHER): Payer: Self-pay | Admitting: Internal Medicine

## 2014-03-02 ENCOUNTER — Ambulatory Visit (HOSPITAL_BASED_OUTPATIENT_CLINIC_OR_DEPARTMENT_OTHER): Payer: Medicare Other | Attending: Internal Medicine | Admitting: Internal Medicine

## 2014-03-02 VITALS — BP 158/78 | HR 57 | Wt 183.2 lb

## 2014-03-02 DIAGNOSIS — I509 Heart failure, unspecified: Secondary | ICD-10-CM | POA: Insufficient documentation

## 2014-03-02 DIAGNOSIS — I5020 Unspecified systolic (congestive) heart failure: Secondary | ICD-10-CM

## 2014-03-02 DIAGNOSIS — I482 Chronic atrial fibrillation, unspecified: Secondary | ICD-10-CM

## 2014-03-02 DIAGNOSIS — I10 Essential (primary) hypertension: Secondary | ICD-10-CM | POA: Insufficient documentation

## 2014-03-02 MED ORDER — LISINOPRIL 20 MG OR TABS
20.0000 mg | ORAL_TABLET | Freq: Every day | ORAL | Status: DC
Start: 2014-03-02 — End: 2014-06-17

## 2014-03-02 MED ORDER — ATORVASTATIN CALCIUM 80 MG OR TABS
80.0000 mg | ORAL_TABLET | Freq: Every day | ORAL | Status: DC
Start: 2014-03-02 — End: 2015-03-20

## 2014-03-02 MED ORDER — SPIRONOLACTONE 25 MG OR TABS
ORAL_TABLET | ORAL | Status: DC
Start: 2014-03-02 — End: 2014-04-06

## 2014-03-02 NOTE — Patient Instructions (Addendum)
Adding half a tablet of spironolactone 12.5 mg to improve blood pressure which is high today.    Adding a statin medicine for high cholesterol to prevent heart disease and strokes.    We discussed avoiding alcohol.    We discussed walking 30 minutes daily for exercise.    You will get a primary care doctor.    Get your fasting blood work on the day you come back for your nurse visit.

## 2014-03-02 NOTE — Progress Notes (Signed)
OUTPATIENT VISIT    ID/CHIEF COMPLAINT: Mark Poole is a 63 year old male here for Atrial Fibrillation      HISTORY:  63 year old male returns for follow-up of his chronic atrial fibrillation and heart failure with reduced ejection fraction, now improved, and his hypertension.  He feels well and denies chest pain shortness breath palpitations lower extremity edema presyncope or syncope.  He is adherent with his medications but had been continuing warfarin for quite a while and is only now transitioning to a new anticoagulant.  He is active working as a Games developer but does not exercise in any way.  He drinks up to 1 drink of alcohol every other day and never more than one drink a time.    REVIEW OF SYSTEMS:  A complete ROS was performed and is negative, except where noted in the above HPI.    PHYSICAL EXAM:  VITALS - Blood pressure 158/78, pulse 57, weight 183 lb 3.2 oz (83.099 kg), SpO2 99 %. systolic blood pressure at end of visit was 160s to 170s.  GENERAL - well appearing no acute distress  HEENT - anicteric mucous membranes moist  RESPIRATORY - clear to auscultation bilaterally  CARDIOVASCULAR - regular rate and rhythm normal S1-S2 without murmur rub or gallop, no lower extremity edema, no jugular venous distention  ABDOMEN - normal bowel tones  SKIN - warm dry pink  NEURO - face is symmetric  MENTAL STATUS - moving all extremities equally    DIAGNOSTIC STUDIES:    ASSESSMENT/PLAN:  Lizandro was seen today for atrial fibrillation follow-up of his now resolved heart failure with reduced ejection fraction, thought to have been tachycardia mediated or alcohol mediated.  He is asymptomatic.  His cardiac exam is normal except for persistent systolic hypertension today.  I am adding 12.5 mg of spironolactone to improve blood pressure control.  We discussed primary prevention use of a statin and reviewed the [facility] statin decision tool.  He agreed to start a statin.  We discussed avoiding all alcohol given his  issues with alcoholism.  We agreed that he would start walking 30 minutes daily for exercise.  He is going to go find a primary care provider now that he has [employer].     For his atrial fibrillation we will continue a rhythm control strategy even his symptomatic heart failure from atrial fibrillation.  We will continue amiodarone and rate control with his metoprolol.We will recheck PFTs and thyroid studies.    He is well compensated in terms of his heart failure with reduced ejection fraction we can discontinue his furosemide as he has had no evidence of volume retention over the last year and appears to be quite well compensated.  At the same time we'll be starting low-dose spironolactone for his hypertension.      He will return in 3-4 weeks for a nurse follow-up to get fasting lab work and recheck blood pressure and electrolytes.    Diagnoses and associated orders for this visit:    Chronic atrial fibrillation    - LIPID PANEL  - BASIC METABOLIC PANEL  - CBC (HEMOGRAM)  - Atorvastatin Calcium 80 MG Oral Tab; Take 1 tablet (80 mg) by mouth daily.  - Spironolactone 25 MG Oral Tab; Half a tablet once per day.    HFrEF (heart failure with reduced ejection fraction)  - LIPID PANEL  - BASIC METABOLIC PANEL  - CBC (HEMOGRAM)  - Atorvastatin Calcium 80 MG Oral Tab; Take 1 tablet (80  mg) by mouth daily.  - Spironolactone 25 MG Oral Tab; Half a tablet once per day.  - Lisinopril 20 MG Oral Tab; Take 1 tablet (20 mg) by mouth daily.            Return in about 6 months (around 09/01/2014) for nurse f/u in 3-4 weeks for blood pressure check, electrolytes.        ------------------------------------------------------------------------------------------------------------------  SUPPLEMENTAL INFO:    PROBLEM LIST:  Patient Active Problem List    Diagnosis Date Noted   . HTN (hypertension) 03/02/2014   . NHL (non-Hodgkin's lymphoma) 03/03/2013     In lungs  Radiation therapy     . Chronic anticoagulation 02/02/2013      [facility] enrolled 08/19/12     . 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 HFrEF (heart failure with reduced ejection fraction) 09/03/2012     07/08/12 TTE: EF 36%.Left ventricle is normal in size and thickness. Moderate global hypokinesis. Right ventricle is normal in size with moderate systolic dysfunction. Mild-moderate tricuspid regurgitation: estimated pulmonary artery systolic pressures are normal (29-34 mmHg).  Moderate mitral regurgitation.Trileaflet aortic valve with mild valvular stenosis and trace regurgitation.No pericardial effusion       . Left atrial thrombus 08/30/2012     08/30/12. TEE. Small thrombus in the left atrial appendage. A bright immobile structure in the appendage is again visualized, possibly representing a pectinate muscle vs calcified thrombus. Some views show an aspect of this mass that appears consistent with mobile thrombus. There is spontaneous echo contrast in the appendage and left atrium. Cardioversion was thus deferred.          . Atrial fibrillation 08/19/2012        SOCIAL HISTORY:  History     Social History   . Marital Status: Single     Spouse Name: N/A     Number of Children: N/A   . Years of Education: N/A     Occupational History   . Not on file.     Social History Main Topics   . Smoking status: Former Smoker -- 1.00 packs/day for 20 years     Types: Cigarettes   . Smokeless tobacco: Not on file   . Alcohol Use: No   . Drug Use: No   . Sexual Activity: Not on file     Other Topics Concern   . Not on file     Social History Narrative       CURRENT MEDICATIONS:  as of end of visit on 03/02/2014    Current Outpatient Prescriptions   Medication Sig Dispense Refill   . Amiodarone HCl 200 MG Oral Tab Take 1 tablet (200 mg) by mouth 2 times a day. 65 tablet 11   . Atorvastatin Calcium 80 MG Oral Tab Take 1 tablet (80 mg) by mouth daily. 90 tablet 3   . Lisinopril 20 MG Oral Tab Take 1 tablet (20 mg) by mouth daily. 90 tablet 0   . Metoprolol Succinate ER 50 MG Oral TABLET SR 24  HR Take 1 tablet (50 mg) by mouth daily. Do not chew or crush. 90 tablet 11   . Rivaroxaban 20 MG Oral Tab Take 1 tablet (20 mg) by mouth daily. Start 5 days after stopping warfarin 90 tablet 11   . Spironolactone 25 MG Oral Tab Half a tablet once per day. 90 tablet 12     No current facility-administered medications for this visit.       ALLERGIES:  Review of patient's allergies indicates:  No Known Allergies     VITAL SIGN TRENDS:       Wt Readings from Last 5 Encounters:   03/02/14 183 lb 3.2 oz (83.099 kg)   05/26/13 188 lb 3.2 oz (85.367 kg)   03/03/13 189 lb (85.73 kg)   02/07/13 188 lb 7.9 oz (85.5 kg)   01/31/13 186 lb 1.1 oz (84.4 kg)          BP Readings from Last 5 Encounters:   03/02/14 158/78   05/26/13 132/71   03/03/13 120/88   02/07/13 111/68   01/31/13 112/65        STUDIES:  Orders Only on 05/23/2013   Component Date Value Ref Range Status   . B_Type Natriuretic Peptide 38/12/1749 Duplicate request  <025 pg/mL Final   . Sodium 85/27/7824 Duplicate request  235 - 145 mEq/L Final   . Potassium 36/14/4315 Duplicate request  3.6 - 5.2 mEq/L Final   . Chloride 40/10/6759 Duplicate request  98 - 108 mEq/L Final   . Carbon Dioxide, Total 95/11/3265 Duplicate request  22 - 32 mEq/L Final   . Anion Gap 12/45/8099 Duplicate request  4 - 12 Final   . Glucose 83/38/2505 Duplicate request  62 - 125 mg/dL Final   . Urea Nitrogen 39/76/7341 Duplicate request  8 - 21 mg/dL Final   . Creatinine 93/79/0240 Duplicate request  9.73 - 1.18 mg/dL Final   . Calcium 53/29/9242 Duplicate request  8.9 - 10.2 mg/dL Final   . GFR, Calc, European American 68/34/1962 Duplicate request  >22 mL/min Final   . GFR, Calc, African American 97/98/9211 Duplicate request  >94 mL/min Final   . GFR, Information 17/40/8144 Duplicate request   Final   . Prothrombin Time Patient 05/26/2013 38.0* 10.7 - 15.6 s Final   . Prothrombin INR 05/26/2013 4.0* 0.8 - 1.3 Final

## 2014-03-26 ENCOUNTER — Telehealth (HOSPITAL_BASED_OUTPATIENT_CLINIC_OR_DEPARTMENT_OTHER): Payer: Self-pay

## 2014-03-26 NOTE — Telephone Encounter (Signed)
CONFIRMED PHONE NUMBER: [phone]  CALLERS FIRST AND LAST NAME: Marisue Brooklyn    FACILITY NAME: n/a TITLE: n/a  CALLERS RELATIONSHIP:Self  RETURN CALL: General message OK     SUBJECT: Appointment Request   REASON FOR REQUEST: Reschedule, patient currently ill    REQUEST APPOINTMENT WITH: Cardiology RN  SYMPTOMS:   REFERRING PROVIDER: established  REQUESTED DATE:   REQUESTED TIME:   UNABLE TO APPOINT: Other: per SOP

## 2014-03-27 ENCOUNTER — Ambulatory Visit (HOSPITAL_BASED_OUTPATIENT_CLINIC_OR_DEPARTMENT_OTHER): Payer: Medicare Other | Attending: Internal Medicine

## 2014-03-27 ENCOUNTER — Telehealth (HOSPITAL_BASED_OUTPATIENT_CLINIC_OR_DEPARTMENT_OTHER): Payer: Self-pay | Admitting: Internal Medicine

## 2014-03-27 VITALS — BP 152/90 | HR 63 | Resp 14 | Ht 70.0 in | Wt 183.1 lb

## 2014-03-27 DIAGNOSIS — I482 Chronic atrial fibrillation: Secondary | ICD-10-CM | POA: Insufficient documentation

## 2014-03-27 DIAGNOSIS — I5020 Unspecified systolic (congestive) heart failure: Secondary | ICD-10-CM | POA: Insufficient documentation

## 2014-03-27 DIAGNOSIS — I509 Heart failure, unspecified: Secondary | ICD-10-CM | POA: Insufficient documentation

## 2014-03-27 LAB — BASIC METABOLIC PANEL
Anion Gap: 9 (ref 4–12)
Calcium: 9.5 mg/dL (ref 8.9–10.2)
Carbon Dioxide, Total: 28 mEq/L (ref 22–32)
Chloride: 98 mEq/L (ref 98–108)
Creatinine: 1.5 mg/dL — ABNORMAL HIGH (ref 0.51–1.18)
GFR, Calc, African American: 57 mL/min — ABNORMAL LOW (ref >59)
GFR, Calc, European American: 47 mL/min — ABNORMAL LOW (ref >59)
Glucose: 94 mg/dL (ref 62–125)
Potassium: 4.5 mEq/L (ref 3.6–5.2)
Sodium: 135 mEq/L (ref 135–145)
Urea Nitrogen: 18 mg/dL (ref 8–21)

## 2014-03-27 LAB — LIPID PANEL
Cholesterol (LDL): 79 mg/dL (ref <130)
Cholesterol/HDL Ratio: 2
HDL Cholesterol: 89 mg/dL (ref >39)
Non-HDL Cholesterol: 92 mg/dL (ref 0–159)
Total Cholesterol: 181 mg/dL (ref <200)
Triglyceride: 63 mg/dL (ref <150)

## 2014-03-27 LAB — CBC (HEMOGRAM)
Hematocrit: 37 % — ABNORMAL LOW (ref 38–50)
Hemoglobin: 12.4 g/dL — ABNORMAL LOW (ref 13.0–18.0)
MCH: 34.1 pg — ABNORMAL HIGH (ref 27.3–33.6)
MCHC: 33.7 g/dL (ref 32.2–36.5)
MCV: 101 fL — ABNORMAL HIGH (ref 81–98)
Platelet Count: 215 10*3/uL (ref 150–400)
RBC: 3.64 mil/uL — ABNORMAL LOW (ref 4.40–5.60)
RDW-CV: 13.8 % (ref 11.6–14.4)
WBC: 6.71 10*3/uL (ref 4.30–10.00)

## 2014-03-27 LAB — THYROID STIMULATING HORMONE: Thyroid Stimulating Hormone: 63 u[IU]/mL — ABNORMAL HIGH (ref 0.400–5.000)

## 2014-03-27 NOTE — Progress Notes (Signed)
Patient is here for a Nurse Visit on 03/27/2014 as ordered by Dr. Jabier Mutton. Pt is here today for a f/u w/ labs, med toleration, bp, wt & symptom check.      Pt reports feeling sick today. He started to get cold-like symptoms yesterday and is self-treating with Coricidin and Nyquil. Pt denies CP, palpitations, chest pressure, PND, orthopnea, LH, dizziness, syncope, orthostatic, or SOB. He is taking Spironolactone 12.5mg  daily as prescribed but did not discontinue his Lasix so he has been taking both at the same time. His weight is stable. BP elevated (see below). Labs drawn today (see below).     Vitals:  Filed Vitals:    03/27/14 1455 03/27/14 1503   BP: 160/90 152/90   Pulse: 63    Resp: 14    Height: 5\' 10"  (1.778 m)    Weight: 183 lb 1.6 oz (83.054 kg)        Medications:  Current Outpatient Prescriptions   Medication Sig Dispense Refill   . Amiodarone HCl 200 MG Oral Tab Take 1 tablet (200 mg) by mouth 2 times a day. 65 tablet 11   . Atorvastatin Calcium 80 MG Oral Tab Take 1 tablet (80 mg) by mouth daily. 90 tablet 3   . Chlorpheniramine-DM (CORICIDIN HBP COUGH/COLD OR) Take 2 tablets by mouth 4 times a day as needed (cold symptoms x1 day. Started taking 03/26/14.).     Marland Kitchen Lisinopril 20 MG Oral Tab Take 1 tablet (20 mg) by mouth daily. 90 tablet 0   . Metoprolol Succinate ER 50 MG Oral TABLET SR 24 HR Take 1 tablet (50 mg) by mouth daily. Do not chew or crush. 90 tablet 11   . Phenyleph-Doxylamine-DM-APAP (NYQUIL SEVERE COLD/FLU OR) Take 2 tablets by mouth at bedtime as needed (for cold symtpoms. Started taking 03/26/14.).     Marland Kitchen Rivaroxaban 20 MG Oral Tab Take 1 tablet (20 mg) by mouth daily. Start 5 days after stopping warfarin 90 tablet 11   . Spironolactone 25 MG Oral Tab Half a tablet once per day. 90 tablet 12     No current facility-administered medications for this visit.       Lab Results:    Results for orders placed or performed in visit on 38/75/64   BASIC METABOLIC PANEL   Result Value Ref Range    Sodium 135  135 - 145 mEq/L    Potassium 4.5 3.6 - 5.2 mEq/L    Chloride 98 98 - 108 mEq/L    Carbon Dioxide, Total 28 22 - 32 mEq/L    Anion Gap 9 4 - 12    Glucose 94 62 - 125 mg/dL    Urea Nitrogen 18 8 - 21 mg/dL    Creatinine 1.50 (H) 0.51 - 1.18 mg/dL    Calcium 9.5 8.9 - 10.2 mg/dL    GFR, Calc, European American 47 (L) >59 mL/min    GFR, Calc, African American 57 (L) >59 mL/min    GFR, Information       Calculated GFR in mL/min/1.73 m2 by MDRD equation.  Inaccurate with changing renal function.  See [email or web address]     Results for orders placed or performed in visit on 03/02/14   CBC (HEMOGRAM)   Result Value Ref Range    WBC 6.71 4.30 - 10.00 THOU/uL    RBC 3.64 (L) 4.40 - 5.60 mil/uL    Hemoglobin 12.4 (L) 13.0 - 18.0 g/dL    Hematocrit 37 (L) 38 - 50 %  MCV 101 (H) 81 - 98 fL    MCH 34.1 (H) 27.3 - 33.6 pg    MCHC 33.7 32.2 - 36.5 g/dL    Platelet Count 215 150 - 400 THOU/uL    RDW-CV 13.8 11.6 - 14.4 %     Results for orders placed or performed in visit on 03/02/14   LIPID PANEL   Result Value Ref Range    Cholesterol (Total) 181 <200 mg/dL    Triglyceride 63 <150 mg/dL    Cholesterol (HDL) 89 >39 mg/dL    Cholesterol (LDL) 79 <130 mg/dL    Non-HDL Cholesterol 92 0 - 159 mg/dL    Cholesterol/HDL Ratio 2.0     Lipid Panel, Additional Info. (NOTE)        Plan:    Md Jabier Mutton aware of the following mentioned above.  Per Md Jabier Mutton orders: Discontinue Lasix, increase Lisinopril to 20mg  BID, eliminate alcohol from diet, return to clinic in two weeks for a nurse visit with labs, return to clinic in two months for a f/up with Dr. Jabier Mutton.     Verbal & written instructions given to pt.  Pt states understanding regarding Dr. April Manson current plan & how to contact us w/ questions.  F/u appt made & reminder letter given to pt. WCTM.

## 2014-03-27 NOTE — Patient Instructions (Signed)
1. Stop taking Furosemide (Lasix) completely.     2. Increase Lisinopril to 20mg  TWICE daily.     3. Eliminate all drinking.     4. Return to clinic for a nurse visit in two weeks. Please have your labs drawn prior to this visit.     5. Follow up with Dr. Jabier Mutton in 2 months.     6. Call clinic if you have any questions or concerns.

## 2014-03-27 NOTE — Telephone Encounter (Signed)
Left msg on pt's vm to call clinic back to reschedule nurse visit

## 2014-03-27 NOTE — Telephone Encounter (Signed)
CONFIRMED PHONE NUMBER: [phone]  CALLERS FIRST AND LAST NAME: Marisue Brooklyn  FACILITY NAME: NA TITLE: NA  CALLERS RELATIONSHIP:Self  RETURN CALL: Detailed message on voicemail only     SUBJECT: Appointment Request   REASON FOR REQUEST: Patient would like to have a nurse visit at [place]: Nurse Visit  SYMPTOMS: Nurse Visit  REFERRING PROVIDER: Donavan Foil  REQUESTED DATE: 03/27/2014  REQUESTED TIME: 2:30pm  UNABLE TO APPOINT: Other: per SOP, [facility] sent message.     Patient initially called on 03/26/2013 to cancel the appointment because patient was not feeling well.  Patient states he is feeling better today and he would like to keep the appointment this afternoon.    Please DO NOT CANCEL the appointment.  Thank you

## 2014-03-28 ENCOUNTER — Other Ambulatory Visit (HOSPITAL_BASED_OUTPATIENT_CLINIC_OR_DEPARTMENT_OTHER): Payer: Self-pay | Admitting: Internal Medicine

## 2014-03-28 ENCOUNTER — Encounter (HOSPITAL_BASED_OUTPATIENT_CLINIC_OR_DEPARTMENT_OTHER): Payer: Self-pay | Admitting: Internal Medicine

## 2014-03-28 DIAGNOSIS — E039 Hypothyroidism, unspecified: Secondary | ICD-10-CM

## 2014-03-29 ENCOUNTER — Ambulatory Visit (HOSPITAL_BASED_OUTPATIENT_CLINIC_OR_DEPARTMENT_OTHER): Payer: Medicare Other | Attending: Endocrinology | Admitting: Internal Medicine

## 2014-03-29 VITALS — BP 114/71 | HR 79 | Temp 97.5°F | Ht 70.0 in | Wt 178.0 lb

## 2014-03-29 DIAGNOSIS — E032 Hypothyroidism due to medicaments and other exogenous substances: Secondary | ICD-10-CM | POA: Insufficient documentation

## 2014-03-29 MED ORDER — LEVOTHYROXINE SODIUM 125 MCG OR TABS
125.0000 ug | ORAL_TABLET | Freq: Every day | ORAL | Status: DC
Start: 2014-03-29 — End: 2014-07-17

## 2014-03-29 NOTE — Progress Notes (Signed)
Endocrinology Clinic  Initial Consult Note    PCP  Outside, Pcp      ID/Chief Complaint  [name] is a 64 year old-year-old man on amiodorone for HFrEF and atrial fib  referred by Mark Poole for elevated TSH.      History of Present Illness  Mark Poole is a 64 year old-year-old man have atrial fibrillation, heart failure with reduced ejection fraction, hypertension who has been on amiodarone for rhythm control and metoprolol for rate control.  On his routine thyroid screening studies his TSH has risen from 4.5->63 since last checked 1 year prior.     The patient reports he's been on amiodarone for about a year.  He denies any past history of thyroid disease or thyroid disorders in himself or his family.  He is asymptomatic.  On review of systems: +Fatigue for a few months, off and on. Sometimes constipated. Denies tremors, palpations, weight gain hair loss, skin or nail changes.       THYROID FUNCTION Latest Ref Rng 02/15/2012 05/30/2012 03/03/2013   TSH 0.400 - 5.000 uIU/mL 4.172 5.050 (H) 4.507   T4, Free 0.6 - 1.2 ng/dL  0.9 0.9     03/27/2014   63.000 (H)              Past Medical History  Patient Active Problem List   Diagnosis   . Atrial fibrillation   . Left atrial thrombus   . Alcohol abuse, in remission   . HFrEF (heart failure with reduced ejection fraction)   . Chronic anticoagulation   . NHL (non-Hodgkin's lymphoma)   . HTN (hypertension)         Medications  Current Outpatient Prescriptions   Medication Sig Dispense Refill   . Amiodarone HCl 200 MG Oral Tab Take 1 tablet (200 mg) by mouth 2 times a day. 65 tablet 11   . Atorvastatin Calcium 80 MG Oral Tab Take 1 tablet (80 mg) by mouth daily. 90 tablet 3   . Chlorpheniramine-DM (CORICIDIN HBP COUGH/COLD OR) Take 2 tablets by mouth 4 times a day as needed (cold symptoms x1 day. Started taking 03/26/14.).     Marland Kitchen Lisinopril 20 MG Oral Tab Take 1 tablet (20 mg) by mouth daily. 90 tablet 0   . Metoprolol Succinate ER 50 MG Oral TABLET SR 24 HR Take 1  tablet (50 mg) by mouth daily. Do not chew or crush. 90 tablet 11   . Phenyleph-Doxylamine-DM-APAP (NYQUIL SEVERE COLD/FLU OR) Take 2 tablets by mouth at bedtime as needed (for cold symtpoms. Started taking 03/26/14.).     Marland Kitchen Rivaroxaban 20 MG Oral Tab Take 1 tablet (20 mg) by mouth daily. Start 5 days after stopping warfarin 90 tablet 11   . Spironolactone 25 MG Oral Tab Half a tablet once per day. 90 tablet 12     No current facility-administered medications for this visit.       Allergies  Review of patient's allergies indicates no known allergies.      Family History  Denies family history of endocrinologic/thyroid disorders    Social History  Patient is a former smoker.  Patient drinks approximately 10 alcoholic beverages (gin) per week.    Review of Systems  See HPI and scanned health history questionnaire.    Physical Exam  BP 114/71 mmHg  Pulse 79  Temp(Src) 97.5 F (36.4 C) (Tympanic)  Ht 5\' 10"  (1.778 m)  Wt 178 lb (80.74 kg)  BMI 25.54 kg/m2  SpO2 100%  Gen:  Well appearing, in NAD,  appears comfortable.   HENT: Missing several posterior teeth.  Oropharynx clear w/out erythema or exudates  Eyes: no conjunctival injection or pallor  Neck:  trachea midline,  no thyromegaly or nodules palpated  Lymph:  No cervical or supraclavicular lymphadenopathy  Chest:  Respirations non laboed, LCTAB  CV:  RRR SEM 3/6 RUSB, No edema  Skin:  Warm, dry. No rashes. Nails appear healthy  Abd: Soft, non-tender, non-distended  Ext:  Prominent veins in the lower extremities.  Distal pulses intact  Neuro: Moves all extremities, grossly non-focal      Pertinent Labs and Studies    THYROID FUNCTION Latest Ref Rng 02/15/2012 05/30/2012 03/03/2013   TSH 0.400 - 5.000 uIU/mL 4.172 5.050 (H) 4.507   T4, Free 0.6 - 1.2 ng/dL  0.9 0.9     03/27/2014   63.000 (H)          Assessment and Plan  Mark Poole is a 64 year old-year-old male with atrial fibrillation, heart failure, hypertension with no history of pre-existing thyroid  disease is here for elevated TSH the setting of amiodarone use, consistent with the diagnosis of Amiodarone associated hypothyroidism. We discussed with the patient that since his atrial fibrillation is well controlled with amiodarone he should continue with this medication and we will treat the hypothyroidism with thyroid supplementation.  -Levothyroxine 125 MCG daily.  Counseled on taking on an empty stomach, without food or milk.  -Repeat TSH in 2 months  -Return to clinic in 3-4 months    This patient was seen with attending physician Dr. Desmond Dike.      Gwenyth Bender, MD  Internal Medicine R3

## 2014-04-03 NOTE — Progress Notes (Signed)
I saw and evaluated the patient and agree with Dr.Deeds's note.

## 2014-04-03 NOTE — Addendum Note (Signed)
Addended by: Randa Lynn on: 04/03/2014 04:44 PM     Modules accepted: Level of Service

## 2014-04-06 ENCOUNTER — Encounter (HOSPITAL_BASED_OUTPATIENT_CLINIC_OR_DEPARTMENT_OTHER): Payer: Self-pay | Admitting: Internal Medicine

## 2014-04-06 ENCOUNTER — Ambulatory Visit (HOSPITAL_BASED_OUTPATIENT_CLINIC_OR_DEPARTMENT_OTHER): Payer: Medicare Other | Attending: Internal Medicine

## 2014-04-06 ENCOUNTER — Other Ambulatory Visit (HOSPITAL_BASED_OUTPATIENT_CLINIC_OR_DEPARTMENT_OTHER): Payer: Self-pay | Admitting: Internal Medicine

## 2014-04-06 DIAGNOSIS — I5020 Unspecified systolic (congestive) heart failure: Secondary | ICD-10-CM | POA: Insufficient documentation

## 2014-04-06 DIAGNOSIS — I482 Chronic atrial fibrillation, unspecified: Secondary | ICD-10-CM

## 2014-04-06 LAB — BASIC METABOLIC PANEL
Anion Gap: 5 (ref 4–12)
Calcium: 9.2 mg/dL (ref 8.9–10.2)
Carbon Dioxide, Total: 28 mEq/L (ref 22–32)
Chloride: 103 mEq/L (ref 98–108)
Creatinine: 1.28 mg/dL — ABNORMAL HIGH (ref 0.51–1.18)
GFR, Calc, African American: >60 mL/min (ref >59)
GFR, Calc, European American: 56 mL/min — ABNORMAL LOW (ref >59)
Glucose: 95 mg/dL (ref 62–125)
Potassium: 4.3 mEq/L (ref 3.6–5.2)
Sodium: 136 mEq/L (ref 135–145)
Urea Nitrogen: 24 mg/dL — ABNORMAL HIGH (ref 8–21)

## 2014-04-06 MED ORDER — SPIRONOLACTONE 25 MG OR TABS
ORAL_TABLET | ORAL | Status: DC
Start: 2014-04-06 — End: 2015-03-15

## 2014-04-06 NOTE — Progress Notes (Signed)
SBP remains elevated. WIll increase spironolactone frm 12.5 mg to 25 mg and recheck BMP in 2 weeks.

## 2014-04-07 ENCOUNTER — Telehealth (HOSPITAL_BASED_OUTPATIENT_CLINIC_OR_DEPARTMENT_OTHER): Payer: Self-pay

## 2014-04-07 ENCOUNTER — Ambulatory Visit (HOSPITAL_BASED_OUTPATIENT_CLINIC_OR_DEPARTMENT_OTHER): Payer: Medicare Other

## 2014-04-07 NOTE — Progress Notes (Signed)
Patient is here for a Nurse Visit on 04/06/2014 as ordered by Dr. Jabier Mutton on 03/27/2014. At this visit patient was stopped on his lasix, he was increased on his lisinopril.     Patient Active Problem List   Diagnosis   . Atrial fibrillation   . Left atrial thrombus   . Alcohol abuse, in remission   . HFrEF (heart failure with reduced ejection fraction)   . Chronic anticoagulation   . NHL (non-Hodgkin's lymphoma)   . HTN (hypertension)         Vitals:  Filed Vitals:    04/06/14 1611   BP: 151/82   Pulse: 56   Resp: 16   Weight: 181 lb 10.5 oz (82.399 kg)   SpO2: 98%       Medications:  Current Outpatient Prescriptions   Medication Sig Dispense Refill   . Amiodarone HCl 200 MG Oral Tab Take 1 tablet (200 mg) by mouth 2 times a day. 65 tablet 11   . Atorvastatin Calcium 80 MG Oral Tab Take 1 tablet (80 mg) by mouth daily. 90 tablet 3   . Chlorpheniramine-DM (CORICIDIN HBP COUGH/COLD OR) Take 2 tablets by mouth 4 times a day as needed (cold symptoms x1 day. Started taking 03/26/14.).     Marland Kitchen Levothyroxine Sodium 125 MCG Oral Tab Take 1 tablet (125 mcg) by mouth daily. 90 tablet 3   . Lisinopril 20 MG Oral Tab Take 1 tablet (20 mg) by mouth daily. 90 tablet 0   . Metoprolol Succinate ER 50 MG Oral TABLET SR 24 HR Take 1 tablet (50 mg) by mouth daily. Do not chew or crush. 90 tablet 11   . Phenyleph-Doxylamine-DM-APAP (NYQUIL SEVERE COLD/FLU OR) Take 2 tablets by mouth at bedtime as needed (for cold symtpoms. Started taking 03/26/14.).     Marland Kitchen Rivaroxaban 20 MG Oral Tab Take 1 tablet (20 mg) by mouth daily. Start 5 days after stopping warfarin 90 tablet 11   . Spironolactone 25 MG Oral Tab full tablet once per day. INCREASED TODAY.. 90 tablet 12     No current facility-administered medications for this visit.       Lab Results:    Results for orders placed or performed in visit on 52/84/13   BASIC METABOLIC PANEL   Result Value Ref Range    Sodium 136 135 - 145 mEq/L    Potassium 4.3 3.6 - 5.2 mEq/L    Chloride 103 98 - 108 mEq/L    Carbon Dioxide, Total 28 22 - 32 mEq/L    Anion Gap 5 4 - 12    Glucose 95 62 - 125 mg/dL    Urea Nitrogen 24 (H) 8 - 21 mg/dL    Creatinine 1.28 (H) 0.51 - 1.18 mg/dL    Calcium 9.2 8.9 - 10.2 mg/dL    GFR, Calc, European American 56 (L) >59 mL/min    GFR, Calc, African American >60 >59 mL/min    GFR, Information       Calculated GFR in mL/min/1.73 m2 by MDRD equation.  Inaccurate with changing renal function.  See http://depts.[email or web address].html     Patient is here today to follow up with BP after stopping lasix and then increasing lisinopril. Patient reports he has made these medication changes. The only complaint is that the patient had a deep gum cleaning on Monday and he is still bleeding, although it has slow down. Patient denies chest pain, no shortness of breath. He is working 5-6 hours 2-3 times a week. Patient  does endorse some dizziness but has not had any falls, syncope or near syncope. Patient denies orthopnea, no PND and no BLE edema.     RRR, no BLE edema. Lungs CTA.    Plan:  Spoke to Dr. Jabier Mutton who ordered below:  1. Increase Spironolactone to 25 mg once daily.  2. RN visit in 2 weeks for BP check and labs.     Patient was given written and verbal instructions.

## 2014-04-07 NOTE — Telephone Encounter (Signed)
CONFIRMED PHONE NUMBER: [phone]  CALLERS FIRST AND LAST NAME: Mark Poole  FACILITY NAME: / TITLE: /  CALLERS RELATIONSHIP:Self  RETURN CALL: No call back needed     SUBJECT: Cancellation/Reschedule   REASON FOR CANCELLATION: Sick  RESCHEDULED: YES, Date 04/24/14, Time 1445

## 2014-04-20 ENCOUNTER — Ambulatory Visit (HOSPITAL_BASED_OUTPATIENT_CLINIC_OR_DEPARTMENT_OTHER): Payer: Medicare Other

## 2014-04-21 ENCOUNTER — Ambulatory Visit (HOSPITAL_BASED_OUTPATIENT_CLINIC_OR_DEPARTMENT_OTHER)
Admit: 2014-04-21 | Discharge: 2014-04-21 | Disposition: A | Discharge status: Home / Self Care | Payer: Medicare Other | Attending: Internal Medicine | Admitting: Internal Medicine

## 2014-04-21 DIAGNOSIS — I509 Heart failure, unspecified: Secondary | ICD-10-CM | POA: Insufficient documentation

## 2014-04-21 DIAGNOSIS — I482 Chronic atrial fibrillation: Secondary | ICD-10-CM | POA: Insufficient documentation

## 2014-04-21 LAB — BASIC METABOLIC PANEL
Anion Gap: 4 (ref 4–12)
Calcium: 9.5 mg/dL (ref 8.9–10.2)
Carbon Dioxide, Total: 30 mEq/L (ref 22–32)
Chloride: 102 mEq/L (ref 98–108)
Creatinine: 1.29 mg/dL — ABNORMAL HIGH (ref 0.51–1.18)
GFR, Calc, African American: >60 mL/min (ref >59)
GFR, Calc, European American: 56 mL/min — ABNORMAL LOW (ref >59)
Glucose: 107 mg/dL (ref 62–125)
Potassium: 4.7 mEq/L (ref 3.6–5.2)
Sodium: 136 mEq/L (ref 135–145)
Urea Nitrogen: 17 mg/dL (ref 8–21)

## 2014-04-24 ENCOUNTER — Ambulatory Visit (HOSPITAL_BASED_OUTPATIENT_CLINIC_OR_DEPARTMENT_OTHER): Payer: Medicare Other

## 2014-04-24 ENCOUNTER — Other Ambulatory Visit (HOSPITAL_BASED_OUTPATIENT_CLINIC_OR_DEPARTMENT_OTHER): Payer: Self-pay

## 2014-04-24 ENCOUNTER — Ambulatory Visit (HOSPITAL_BASED_OUTPATIENT_CLINIC_OR_DEPARTMENT_OTHER): Payer: Medicare Other | Attending: Internal Medicine

## 2014-04-24 VITALS — BP 150/88 | HR 54 | Wt 178.3 lb

## 2014-04-24 DIAGNOSIS — I4891 Unspecified atrial fibrillation: Secondary | ICD-10-CM

## 2014-04-24 DIAGNOSIS — Z8679 Personal history of other diseases of the circulatory system: Secondary | ICD-10-CM | POA: Insufficient documentation

## 2014-04-24 DIAGNOSIS — I159 Secondary hypertension, unspecified: Secondary | ICD-10-CM

## 2014-04-24 LAB — BASIC METABOLIC PANEL
Anion Gap: 5 (ref 4–12)
Calcium: 8.7 mg/dL — ABNORMAL LOW (ref 8.9–10.2)
Carbon Dioxide, Total: 30 mEq/L (ref 22–32)
Chloride: 105 mEq/L (ref 98–108)
Creatinine: 1.22 mg/dL — ABNORMAL HIGH (ref 0.51–1.18)
GFR, Calc, African American: >60 mL/min (ref >59)
GFR, Calc, European American: 60 mL/min (ref >59)
Glucose: 95 mg/dL (ref 62–125)
Potassium: 4.2 mEq/L (ref 3.6–5.2)
Sodium: 140 mEq/L (ref 135–145)
Urea Nitrogen: 15 mg/dL (ref 8–21)

## 2014-04-28 NOTE — Progress Notes (Signed)
Patient is here for a Nurse Visit on 04/24/2014 as ordered by Dr. Jabier Mutton on 04/06/2014.     Patient is here today to follow up with BP. Patient did not bring his medications. He reports he thinks he has not been taking one of his medications but he could not remember which. Patient denies CP, he has some dizziness with standing, no shortness of breath. Patient is not orthopneic, no PND and no BLE edema.      RRR, no BLE edema. Lungs CTA      Vitals:  Filed Vitals:    04/24/14 1444   BP: 150/88   Pulse: 54   Weight: 178 lb 5.6 oz (80.899 kg)     BP Readings from Last 4 Encounters:   04/24/14 150/88   04/06/14 151/82   03/29/14 114/71   03/27/14 152/90         Medications:  Current Outpatient Prescriptions   Medication Sig Dispense Refill   . Amiodarone HCl 200 MG Oral Tab Take 1 tablet (200 mg) by mouth 2 times a day. 65 tablet 11   . Atorvastatin Calcium 80 MG Oral Tab Take 1 tablet (80 mg) by mouth daily. 90 tablet 3   . Chlorpheniramine-DM (CORICIDIN HBP COUGH/COLD OR) Take 2 tablets by mouth 4 times a day as needed (cold symptoms x1 day. Started taking 03/26/14.).     Marland Kitchen Levothyroxine Sodium 125 MCG Oral Tab Take 1 tablet (125 mcg) by mouth daily. 90 tablet 3   . Lisinopril 20 MG Oral Tab Take 1 tablet (20 mg) by mouth daily. 90 tablet 0   . Metoprolol Succinate ER 50 MG Oral TABLET SR 24 HR Take 1 tablet (50 mg) by mouth daily. Do not chew or crush. 90 tablet 11   . Phenyleph-Doxylamine-DM-APAP (NYQUIL SEVERE COLD/FLU OR) Take 2 tablets by mouth at bedtime as needed (for cold symtpoms. Started taking 03/26/14.).     Marland Kitchen Rivaroxaban 20 MG Oral Tab Take 1 tablet (20 mg) by mouth daily. Start 5 days after stopping warfarin 90 tablet 11   . Spironolactone 25 MG Oral Tab full tablet once per day. 90 tablet 12     No current facility-administered medications for this visit.         Patient is still hypertensive.     Plan:  Spoke to Dr. Jabier Mutton who asked that the patient return to clinic in the next two weeks with medications and  recheck BP.     Patient understood plan and will call with concerns.

## 2014-05-02 ENCOUNTER — Other Ambulatory Visit (HOSPITAL_BASED_OUTPATIENT_CLINIC_OR_DEPARTMENT_OTHER): Payer: Medicare Other

## 2014-05-02 DIAGNOSIS — Z8679 Personal history of other diseases of the circulatory system: Secondary | ICD-10-CM

## 2014-05-04 ENCOUNTER — Other Ambulatory Visit (HOSPITAL_BASED_OUTPATIENT_CLINIC_OR_DEPARTMENT_OTHER): Payer: Self-pay | Admitting: Internal Medicine

## 2014-05-04 DIAGNOSIS — I4891 Unspecified atrial fibrillation: Secondary | ICD-10-CM

## 2014-05-04 DIAGNOSIS — I5020 Unspecified systolic (congestive) heart failure: Secondary | ICD-10-CM

## 2014-05-05 MED ORDER — AMIODARONE HCL 200 MG OR TABS
ORAL_TABLET | ORAL | Status: DC
Start: 2014-05-05 — End: 2014-05-11

## 2014-05-11 ENCOUNTER — Other Ambulatory Visit (HOSPITAL_BASED_OUTPATIENT_CLINIC_OR_DEPARTMENT_OTHER): Payer: Self-pay | Admitting: Internal Medicine

## 2014-05-11 ENCOUNTER — Ambulatory Visit (HOSPITAL_BASED_OUTPATIENT_CLINIC_OR_DEPARTMENT_OTHER): Payer: Medicare Other | Attending: Internal Medicine

## 2014-05-11 VITALS — BP 122/74 | HR 53 | Resp 15 | Wt 180.7 lb

## 2014-05-11 DIAGNOSIS — I480 Paroxysmal atrial fibrillation: Secondary | ICD-10-CM

## 2014-05-11 DIAGNOSIS — I4891 Unspecified atrial fibrillation: Secondary | ICD-10-CM | POA: Insufficient documentation

## 2014-05-11 DIAGNOSIS — I5020 Unspecified systolic (congestive) heart failure: Secondary | ICD-10-CM

## 2014-05-11 LAB — BASIC METABOLIC PANEL
Anion Gap: 6 (ref 4–12)
Calcium: 9.1 mg/dL (ref 8.9–10.2)
Carbon Dioxide, Total: 28 mEq/L (ref 22–32)
Chloride: 107 mEq/L (ref 98–108)
Creatinine: 1.31 mg/dL — ABNORMAL HIGH (ref 0.51–1.18)
GFR, Calc, African American: >60 mL/min (ref >59)
GFR, Calc, European American: 55 mL/min — ABNORMAL LOW (ref >59)
Glucose: 108 mg/dL (ref 62–125)
Potassium: 4 mEq/L (ref 3.6–5.2)
Sodium: 141 mEq/L (ref 135–145)
Urea Nitrogen: 24 mg/dL — ABNORMAL HIGH (ref 8–21)

## 2014-05-11 MED ORDER — AMIODARONE HCL 200 MG OR TABS
200.0000 mg | ORAL_TABLET | Freq: Every day | ORAL | Status: DC
Start: 2014-05-11 — End: 2015-06-21

## 2014-05-11 NOTE — Progress Notes (Signed)
Patient is here for a Nurse Visit on 05/11/2014 as ordered by Dr. Jabier Mutton for blood pressure and medication check.    Patient brought all medications and is taking as prescribed except for amiodarone, which he takes every morning but sometimes forgets to take at night, estimates he only takes it at night about half the time.     Patient brought his home blood pressure cuff to make sure it was accurate against a manual blood pressure in the clinic. Home BP cuff is accurate and has record of his daily pressures. Per his cuff, typical systolic pressures range from 110-130 and typical diastolic pressures range from 55-85. BP in clinic is 122/74. Patient reports he rarely gets dizzy anymore, only if he stands up from his knees suddenly; he avoids doing this and it has not been an issue for him. Denies CP, orthopnea, PND, syncope,and anorexia.     RRR, lungs CTAB, no BLE or abdominal edema noted.     BMP ordered, results discussed with Dr. Jabier Mutton.    Vitals:  Filed Vitals:    05/11/14 1515   BP: 122/74   Pulse: 53   Resp: 15   Weight: 180 lb 11.2 oz (81.965 kg)   SpO2: 98%       Problem List:  Patient Active Problem List   Diagnosis   . Atrial fibrillation   . Left atrial thrombus   . Alcohol abuse, in remission   . HFrEF (heart failure with reduced ejection fraction)   . Chronic anticoagulation   . NHL (non-Hodgkin's lymphoma)   . HTN (hypertension)       Medications:  Current Outpatient Prescriptions   Medication Sig Dispense Refill   . Amiodarone HCl 200 MG Oral Tab Take 1 tablet (200 mg) by mouth daily. 65 tablet 12   . Atorvastatin Calcium 80 MG Oral Tab Take 1 tablet (80 mg) by mouth daily. 90 tablet 3   . Chlorpheniramine-DM (CORICIDIN HBP COUGH/COLD OR) Take 2 tablets by mouth 4 times a day as needed (cold symptoms x1 day. Started taking 03/26/14.).     Marland Kitchen Levothyroxine Sodium 125 MCG Oral Tab Take 1 tablet (125 mcg) by mouth daily. 90 tablet 3   . Lisinopril 20 MG Oral Tab Take 1 tablet (20 mg) by mouth daily. 90 tablet 0    . Metoprolol Succinate ER 50 MG Oral TABLET SR 24 HR Take 1 tablet (50 mg) by mouth daily. Do not chew or crush. 90 tablet 11   . Phenyleph-Doxylamine-DM-APAP (NYQUIL SEVERE COLD/FLU OR) Take 2 tablets by mouth at bedtime as needed (for cold symtpoms. Started taking 03/26/14.).     Marland Kitchen Rivaroxaban 20 MG Oral Tab Take 1 tablet (20 mg) by mouth daily. Start 5 days after stopping warfarin 90 tablet 11   . Spironolactone 25 MG Oral Tab full tablet once per day. 90 tablet 12     No current facility-administered medications for this visit.       Lab Results:    Results for orders placed or performed in visit on 96/29/52   BASIC METABOLIC PANEL   Result Value Ref Range    Sodium 141 135 - 145 mEq/L    Potassium 4.0 3.6 - 5.2 mEq/L    Chloride 107 98 - 108 mEq/L    Carbon Dioxide, Total 28 22 - 32 mEq/L    Anion Gap 6 4 - 12    Glucose 108 62 - 125 mg/dL    Urea Nitrogen 24 (H) 8 -  21 mg/dL    Creatinine 1.31 (H) 0.51 - 1.18 mg/dL    Calcium 9.1 8.9 - 10.2 mg/dL    GFR, Calc, European American 55 (L) >59 mL/min    GFR, Calc, African American >60 >59 mL/min    GFR, Information       Calculated GFR in mL/min/1.73 m2 by MDRD equation.  Inaccurate with changing renal function.  See http://depts.[email or web address].html         Plan:  Spoke to Dr. Jabier Mutton who ordered below  1. Reduce amiodarone to 200 mg once daily   2. Appointment with Dr. Jabier Mutton in 3 months - patient in referral pool  3. Call clinic with falls, CP/palpitations, or increased dizziness

## 2014-05-31 ENCOUNTER — Other Ambulatory Visit (HOSPITAL_BASED_OUTPATIENT_CLINIC_OR_DEPARTMENT_OTHER): Payer: Self-pay | Admitting: Internal Medicine

## 2014-05-31 DIAGNOSIS — I5020 Unspecified systolic (congestive) heart failure: Secondary | ICD-10-CM

## 2014-05-31 DIAGNOSIS — I4891 Unspecified atrial fibrillation: Secondary | ICD-10-CM

## 2014-06-01 MED ORDER — METOPROLOL SUCCINATE ER 50 MG OR TB24
EXTENDED_RELEASE_TABLET | ORAL | Status: DC
Start: 2014-06-01 — End: 2014-09-07

## 2014-06-17 ENCOUNTER — Other Ambulatory Visit (HOSPITAL_BASED_OUTPATIENT_CLINIC_OR_DEPARTMENT_OTHER): Payer: Self-pay | Admitting: Internal Medicine

## 2014-06-17 DIAGNOSIS — I5020 Unspecified systolic (congestive) heart failure: Secondary | ICD-10-CM

## 2014-06-20 MED ORDER — LISINOPRIL 20 MG OR TABS
ORAL_TABLET | ORAL | Status: DC
Start: 2014-06-20 — End: 2014-09-14

## 2014-06-27 ENCOUNTER — Telehealth (HOSPITAL_BASED_OUTPATIENT_CLINIC_OR_DEPARTMENT_OTHER): Payer: Self-pay

## 2014-06-27 NOTE — Telephone Encounter (Signed)
Patient called to see if when he should stop rivaroxaban due to oral surgery. Asked Dr. Jabier Mutton and this was his response.     "He can take the last dose of rivaroxaban 24 hour before procedure and restart with next dose 25 hours after the  Procedure".    Patient was made aware.

## 2014-07-12 ENCOUNTER — Encounter (HOSPITAL_BASED_OUTPATIENT_CLINIC_OR_DEPARTMENT_OTHER): Payer: Self-pay | Admitting: Endocrinology

## 2014-07-12 ENCOUNTER — Ambulatory Visit (HOSPITAL_BASED_OUTPATIENT_CLINIC_OR_DEPARTMENT_OTHER): Payer: Medicare Other | Attending: Endocrinology | Admitting: Endocrinology

## 2014-07-12 ENCOUNTER — Other Ambulatory Visit (HOSPITAL_BASED_OUTPATIENT_CLINIC_OR_DEPARTMENT_OTHER): Payer: Self-pay | Admitting: Internal Medicine

## 2014-07-12 VITALS — BP 123/77 | HR 74 | Resp 18 | Ht 70.0 in | Wt 172.0 lb

## 2014-07-12 DIAGNOSIS — E032 Hypothyroidism due to medicaments and other exogenous substances: Secondary | ICD-10-CM

## 2014-07-12 DIAGNOSIS — T462X1A Poisoning by other antidysrhythmic drugs, accidental (unintentional), initial encounter: Secondary | ICD-10-CM | POA: Insufficient documentation

## 2014-07-12 LAB — THYROID STIMULATING HORMONE: Thyroid Stimulating Hormone: 0.194 u[IU]/mL — ABNORMAL LOW (ref 0.400–5.000)

## 2014-07-12 NOTE — Progress Notes (Signed)
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 He was asymptomatic, but given his markedly elevated TSH we started him on levothyroxine. He is doing well with this, no tremor, no palpitations. No side effects from the medications.     Physical Exam:  VS: BP 123/77 mmHg  Pulse 74  Resp 18  Ht 5\' 10"  (1.778 m)  Wt 172 lb (78.019 kg)  BMI 24.68 kg/m2  SpO2 100%   Physical Exam   Constitutional: He is well-developed, well-nourished, and in no distress. No distress.   Eyes: Conjunctivae and EOM are normal. Pupils are equal, round, and reactive to light.   Cardiovascular: Normal rate, regular rhythm and intact distal pulses.    Neurological:   No tremor   Skin: Skin is warm and dry.   Psychiatric: Mood and affect normal.       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 Will check repeat thyroid labs and adjust medication as needed.      Return in about 6 months (around 01/11/2015).

## 2014-07-15 DIAGNOSIS — T462X1A Poisoning by other antidysrhythmic drugs, accidental (unintentional), initial encounter: Secondary | ICD-10-CM | POA: Insufficient documentation

## 2014-07-15 DIAGNOSIS — E032 Hypothyroidism due to medicaments and other exogenous substances: Secondary | ICD-10-CM | POA: Insufficient documentation

## 2014-07-17 ENCOUNTER — Other Ambulatory Visit (HOSPITAL_BASED_OUTPATIENT_CLINIC_OR_DEPARTMENT_OTHER): Payer: Self-pay | Admitting: Endocrinology

## 2014-07-17 DIAGNOSIS — E032 Hypothyroidism due to medicaments and other exogenous substances: Secondary | ICD-10-CM

## 2014-07-17 MED ORDER — LEVOTHYROXINE SODIUM 100 MCG OR TABS
100.0000 ug | ORAL_TABLET | Freq: Every day | ORAL | Status: DC
Start: 2014-07-17 — End: 2015-10-01

## 2014-07-17 NOTE — Progress Notes (Signed)
Quick Note:    TSH below normal range, will decrease his levothyroxine to 100 mcg daily, repeat labs in 2 months.  ______

## 2014-07-18 ENCOUNTER — Telehealth (HOSPITAL_BASED_OUTPATIENT_CLINIC_OR_DEPARTMENT_OTHER): Payer: Self-pay | Admitting: Registered Nurse

## 2014-07-18 NOTE — Telephone Encounter (Signed)
Called Mark Poole and relayed Dr. Harold Barban message to take lower dose of levothyroxine and come back for repeat labs in 2 months. Affirmed that new prescription for levothyroxine had been sent to his pharmacy. Patient verbalized understanding, denied questions, will call back if he has further questions.

## 2014-08-17 ENCOUNTER — Encounter (HOSPITAL_BASED_OUTPATIENT_CLINIC_OR_DEPARTMENT_OTHER): Payer: Self-pay | Admitting: Internal Medicine

## 2014-08-17 ENCOUNTER — Ambulatory Visit (HOSPITAL_BASED_OUTPATIENT_CLINIC_OR_DEPARTMENT_OTHER): Payer: Medicare Other | Attending: Internal Medicine | Admitting: Internal Medicine

## 2014-08-17 VITALS — BP 150/74 | HR 55 | Temp 97.9°F | Ht 70.0 in | Wt 172.6 lb

## 2014-08-17 DIAGNOSIS — I5020 Unspecified systolic (congestive) heart failure: Secondary | ICD-10-CM

## 2014-08-17 DIAGNOSIS — I10 Essential (primary) hypertension: Secondary | ICD-10-CM | POA: Insufficient documentation

## 2014-08-17 DIAGNOSIS — I480 Paroxysmal atrial fibrillation: Secondary | ICD-10-CM | POA: Insufficient documentation

## 2014-08-17 DIAGNOSIS — I350 Nonrheumatic aortic (valve) stenosis: Secondary | ICD-10-CM | POA: Insufficient documentation

## 2014-08-17 DIAGNOSIS — I509 Heart failure, unspecified: Secondary | ICD-10-CM | POA: Insufficient documentation

## 2014-08-17 NOTE — Patient Instructions (Addendum)
Doing well with atrial fibrillation treated with amiodarone.    Doing well with heart failure which has resolved.    Doing well with hypertension - please call Mark Poole in 2-3 weeks to tell us your home measurements.    We will check another echocardiogram to evaluate your mild aortic stenosis when you see [place] again in 6 months.    Increase the speed that your dog walks.    We will consider a dose reduction of amiodarone when we see you next.    You will find a primary care doctor.

## 2014-08-17 NOTE — Progress Notes (Signed)
OUTPATIENT VISIT    ID/CHIEF COMPLAINT: Mark Poole is a 64 year old male here for Atrial Fibrillation      HISTORY:  64 year old male with heart failure with reduced ejection fraction, resolved, atrial fibrillation treated on amiodarone, and mild aortic stenosis returns for routine follow-up.  Doing well without chest pain shortness breath palpitations lower extremity edema presyncope or syncope.  Adherent with medications.  Walks his dog 30 minutes a day and very active as a Administrator, [employer].    Hypertensive today but checks his blood pressure regularly at home and has systolic pressures of 161 to 1:30 routinely.    Drinks 2 drinks of alcohol a week.  No bleeding on rivaroxaban.  Hypothyroidism managed well on levothyroxin by endocrine clinic here.    REVIEW OF SYSTEMS:  A complete ROS was performed and is negative, except where noted in the above HPI.    PHYSICAL EXAM:  VITALS - Blood pressure 150/74, pulse 55, temperature 97.9 F (36.6 C), temperature source Temporal, height 5\' 10"  (1.778 m), weight 172 lb 9.6 oz (78.291 kg), SpO2 99 %.  GENERAL - well appearing no acute distress  HEENT - anicteric mucous membranes moist  RESPIRATORY - clear to auscultation bilaterally  CARDIOVASCULAR - regular rate and rhythm normal S1-S2 with a 3/6 crescendo decrescendo murmur radiating to the carotids, loudest at the left upper sternal border, no rub or gallop, normal PMI without heave or thrill, 2+ radial and carotid pulses.  No jugular venous distention, no lower extremity edema.  ABDOMEN - soft nontender  SKIN - warm dry pink  NEURO - face symmetric moving all extremities equally  MENTAL STATUS - alert and oriented    DIAGNOSTIC STUDIES:  Chem-7 in February 2016 showed creatinine 1.3, stable for him, sodium 141, TSH 0.194 in April 2016    ASSESSMENT/PLAN:  Mark Poole was seen today for atrial fibrillation, heart failure with reduced ejection fraction, aortic stenosis.  No cardiac symptoms and a cardiac  exam only notable for his murmur of aortic stenosis.  On appropriate medical therapy for prior heart failure, thought to be tachycardia or alcohol related.  Doing well with rhythm control strategy for atrial fibrillation, in sinus today, and on appropriate levothyroxin for induced hypothyroidism.     We will check a surveillance echo for his mild aortic stenosis we see him again in 6 months.    He will contact us with further blood pressure date over the next 2-3 weeks to ensure this is truly whitecoat hypertension.    Diagnoses and all orders for this visit:    Paroxysmal atrial fibrillation (HCC)    HFrEF (heart failure with reduced ejection fraction) ([place])    Essential hypertension    Nonrheumatic aortic valve stenosis  Orders:  -     REFERRAL TO ECHOCARDIOGRAM          Return in about 6 months (around 02/17/2015).        ------------------------------------------------------------------------------------------------------------------  SUPPLEMENTAL INFO:    PROBLEM LIST:  Patient Active Problem List    Diagnosis Date Noted   . Aortic stenosis 08/17/2014     mild     . 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 HTN (hypertension) 03/02/2014   . NHL (non-Hodgkin's lymphoma) ([place]) 03/03/2013     In lungs  Radiation therapy     . Chronic anticoagulation 02/02/2013     [facility] enrolled 08/19/12     . 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 HFrEF (heart failure  with reduced ejection fraction) ([place]) 09/03/2012     07/08/12 TTE: EF 36%.Left ventricle is normal in size and thickness. Moderate global hypokinesis. Right ventricle is normal in size with moderate systolic dysfunction. Mild-moderate tricuspid regurgitation: estimated pulmonary artery systolic pressures are normal (29-34 mmHg).  Moderate mitral regurgitation.Trileaflet aortic valve with mild valvular stenosis and trace regurgitation.No pericardial effusion       . Left atrial thrombus ([place]) 08/30/2012     08/30/12. TEE. Small thrombus in the left atrial  appendage. A bright immobile structure in the appendage is again visualized, possibly representing a pectinate muscle vs calcified thrombus. Some views show an aspect of this mass that appears consistent with mobile thrombus. There is spontaneous echo contrast in the appendage and left atrium. Cardioversion was thus deferred.          . Atrial fibrillation ([place]) 08/19/2012        SOCIAL HISTORY:  History     Social History   . Marital Status: Single     Spouse Name: N/A   . Number of Children: N/A   . Years of Education: N/A     Occupational History   . Not on file.     Social History Main Topics   . Smoking status: Former Smoker -- 1.00 packs/day for 20 years     Types: Cigarettes   . Smokeless tobacco: Not on file   . Alcohol Use: No   . Drug Use: No   . Sexual Activity: Not on file     Other Topics Concern   . Not on file     Social History Narrative       CURRENT MEDICATIONS:  as of end of visit on 08/17/2014    Current Outpatient Prescriptions   Medication Sig Dispense Refill   . Amiodarone HCl 200 MG Oral Tab Take 1 tablet (200 mg) by mouth daily. 65 tablet 12   . Atorvastatin Calcium 80 MG Oral Tab Take 1 tablet (80 mg) by mouth daily. 90 tablet 3   . Levothyroxine Sodium 100 MCG Oral Tab Take 1 tablet (100 mcg) by mouth daily. 90 tablet 3   . Lisinopril 20 MG Oral Tab TAKE 1 TABLET BY MOUTH EVERY DAY 90 tablet 0   . Metoprolol Succinate ER 50 MG Oral TABLET SR 24 HR TAKE 1 TABLET BY MOUTH EVERY DAY(DO NOT CHEW OR CRUSH) 90 tablet 0   . Rivaroxaban 20 MG Oral Tab Take 1 tablet (20 mg) by mouth daily. Start 5 days after stopping warfarin 90 tablet 11   . Spironolactone 25 MG Oral Tab full tablet once per day. 90 tablet 12     No current facility-administered medications for this visit.       ALLERGIES:  Review of patient's allergies indicates:  No Known Allergies     VITAL SIGN TRENDS:       Wt Readings from Last 5 Encounters:   08/17/14 172 lb 9.6 oz (78.291 kg)   07/12/14 172 lb (78.019 kg)   05/11/14 180 lb  11.2 oz (81.965 kg)   04/24/14 178 lb 5.6 oz (80.899 kg)   04/06/14 181 lb 10.5 oz (82.399 kg)          BP Readings from Last 5 Encounters:   08/17/14 150/74   07/12/14 123/77   05/11/14 122/74   04/24/14 150/88   04/06/14 151/82        STUDIES:  Orders Only on 07/12/2014   Component Date Value Ref Range Status   .  Thyroid Stimulating Hormone 07/12/2014 0.194* 0.400 - 5.000 u[IU]/mL Final

## 2014-09-07 ENCOUNTER — Other Ambulatory Visit (HOSPITAL_BASED_OUTPATIENT_CLINIC_OR_DEPARTMENT_OTHER): Payer: Self-pay | Admitting: Internal Medicine

## 2014-09-07 DIAGNOSIS — I4891 Unspecified atrial fibrillation: Secondary | ICD-10-CM

## 2014-09-07 DIAGNOSIS — I5020 Unspecified systolic (congestive) heart failure: Secondary | ICD-10-CM

## 2014-09-08 MED ORDER — METOPROLOL SUCCINATE ER 50 MG OR TB24
EXTENDED_RELEASE_TABLET | ORAL | Status: DC
Start: 2014-09-08 — End: 2015-05-15

## 2014-09-14 ENCOUNTER — Other Ambulatory Visit (HOSPITAL_BASED_OUTPATIENT_CLINIC_OR_DEPARTMENT_OTHER): Payer: Self-pay | Admitting: Internal Medicine

## 2014-09-14 DIAGNOSIS — I5020 Unspecified systolic (congestive) heart failure: Secondary | ICD-10-CM

## 2014-09-14 MED ORDER — LISINOPRIL 20 MG OR TABS
ORAL_TABLET | ORAL | Status: DC
Start: 2014-09-14 — End: 2015-04-17

## 2014-10-22 ENCOUNTER — Ambulatory Visit: Payer: Self-pay

## 2014-10-22 NOTE — Telephone Encounter (Signed)
Roommate calling  Started bleeding from his mouth today 3 hours ago  Had dental procedure @ [place] last week  Has been bleeding for 3 hrs  Have put pressure on it but cannot get it to stop  She reports he is getting mad now b/c she is speaking about him  Roommate is on the phone, she self reports she has "had a few cocktails" she is slurring her words, is giving non pertinent information    She reports the bleeding is subsiding, she was worried b/c he is on coumadin, he does have cotton in his mouth, he has walked out of the house, when asked if he was dizzy/lightheaded    Advised take him to ED, she reports he has walked out of the house  Advised can try tea bags to try to stop the bleeding if will not go to ED, cb to CCL if need be

## 2014-10-22 NOTE — Telephone Encounter (Signed)
-----   Message from [place] sent at 10/22/2014  3:57 PM PDT -----  Regarding: S/p dental procedure last week and now mouth is bleeding and won't stop.  He is on Warfarin.  >> CHARLOTTE BOURDEAU 10/22/2014 03:57 PM  S/p dental procedure last week and now mouth is bleeding and won't stop.  He is on Warfarin.

## 2014-10-22 NOTE — Telephone Encounter (Signed)
Protocol: TOOTH EXTRACTION-ADULT-AH  Affirmative: [1] Bleeding present > 30 minutes AND [2] using correct technique of direct pressure  Disposition of Go to ED Now suggested.

## 2015-01-10 ENCOUNTER — Encounter (HOSPITAL_BASED_OUTPATIENT_CLINIC_OR_DEPARTMENT_OTHER): Payer: Medicare Other | Admitting: Endocrinology

## 2015-01-10 ENCOUNTER — Ambulatory Visit (HOSPITAL_BASED_OUTPATIENT_CLINIC_OR_DEPARTMENT_OTHER)
Admit: 2015-01-10 | Discharge: 2015-01-10 | Disposition: A | Discharge status: Home / Self Care | Payer: Medicare Other | Attending: Endocrinology | Admitting: Endocrinology

## 2015-01-10 ENCOUNTER — Other Ambulatory Visit (HOSPITAL_BASED_OUTPATIENT_CLINIC_OR_DEPARTMENT_OTHER): Payer: Self-pay | Admitting: Endocrinology

## 2015-01-10 DIAGNOSIS — E032 Hypothyroidism due to medicaments and other exogenous substances: Secondary | ICD-10-CM

## 2015-01-10 LAB — T3: Triiodothyronine (T3): 76 ng/dL (ref 73–178)

## 2015-01-10 LAB — T4, FREE: Thyroxine (Free): 1.3 ng/dL — ABNORMAL HIGH (ref 0.6–1.2)

## 2015-01-10 LAB — THYROID STIMULATING HORMONE: Thyroid Stimulating Hormone: 0.503 u[IU]/mL (ref 0.400–5.000)

## 2015-01-12 ENCOUNTER — Other Ambulatory Visit (HOSPITAL_BASED_OUTPATIENT_CLINIC_OR_DEPARTMENT_OTHER): Payer: Self-pay | Admitting: Endocrinology

## 2015-01-12 DIAGNOSIS — E032 Hypothyroidism due to medicaments and other exogenous substances: Secondary | ICD-10-CM

## 2015-01-12 DIAGNOSIS — T462X1A Poisoning by other antidysrhythmic drugs, accidental (unintentional), initial encounter: Secondary | ICD-10-CM

## 2015-01-12 NOTE — Progress Notes (Signed)
Quick Note:    Free T4 up slightly, but other labs in the normal range on current dose of levothyroxine.  ______

## 2015-03-05 ENCOUNTER — Encounter (HOSPITAL_BASED_OUTPATIENT_CLINIC_OR_DEPARTMENT_OTHER): Payer: Self-pay | Admitting: Registered Nurse

## 2015-03-05 NOTE — Progress Notes (Addendum)
PA electronically approved by [facility] for Xarelto from 02/03/15 - 03/04/16.  Approval attached.

## 2015-03-15 ENCOUNTER — Other Ambulatory Visit: Payer: Self-pay | Admitting: Internal Medicine

## 2015-03-15 DIAGNOSIS — I482 Chronic atrial fibrillation, unspecified: Secondary | ICD-10-CM

## 2015-03-15 DIAGNOSIS — I10 Essential (primary) hypertension: Secondary | ICD-10-CM

## 2015-03-15 DIAGNOSIS — I5020 Unspecified systolic (congestive) heart failure: Secondary | ICD-10-CM

## 2015-03-15 MED ORDER — SPIRONOLACTONE 25 MG OR TABS
25.0000 mg | ORAL_TABLET | Freq: Every day | ORAL | Status: DC
Start: 2015-03-15 — End: 2015-05-17

## 2015-03-15 NOTE — Telephone Encounter (Signed)
The patient last received this medication at the requesting pharmacy on 02/14/2015 #90

## 2015-03-15 NOTE — Telephone Encounter (Signed)
Patient last seen in Cardiology clinic on 08/17/14 and was to return in 6 months.  One refill authorized.  Please schedule follow up visit.

## 2015-03-16 NOTE — Telephone Encounter (Signed)
Left message for patient to call [phone] to schedule follow-up appointment.    When patient calls back: please schedule return appt with Dr.Roth from recall tab.

## 2015-03-20 ENCOUNTER — Other Ambulatory Visit: Payer: Self-pay | Admitting: Internal Medicine

## 2015-03-20 DIAGNOSIS — I482 Chronic atrial fibrillation, unspecified: Secondary | ICD-10-CM

## 2015-03-20 NOTE — Telephone Encounter (Signed)
The patient last received this medication at the requesting pharmacy on 02/14/2015 #90

## 2015-03-21 MED ORDER — ATORVASTATIN CALCIUM 80 MG OR TABS
80.0000 mg | ORAL_TABLET | Freq: Every day | ORAL | Status: DC
Start: 2015-03-21 — End: 2015-06-14

## 2015-04-10 ENCOUNTER — Other Ambulatory Visit: Payer: Self-pay

## 2015-04-17 ENCOUNTER — Other Ambulatory Visit: Payer: Self-pay | Admitting: Internal Medicine

## 2015-04-17 DIAGNOSIS — I10 Essential (primary) hypertension: Secondary | ICD-10-CM

## 2015-04-17 DIAGNOSIS — I480 Paroxysmal atrial fibrillation: Secondary | ICD-10-CM

## 2015-04-17 MED ORDER — LISINOPRIL 20 MG OR TABS
20.0000 mg | ORAL_TABLET | Freq: Every day | ORAL | Status: DC
Start: 2015-04-17 — End: 2015-06-21

## 2015-04-17 MED ORDER — RIVAROXABAN 20 MG OR TABS
20.0000 mg | ORAL_TABLET | Freq: Every day | ORAL | Status: DC
Start: 2015-04-17 — End: 2015-06-21

## 2015-04-17 NOTE — Telephone Encounter (Signed)
Patient last seen on 08/17/14 for visit and was to return in 6 months (around 02/17/2015).  One refill authorized.  Please schedule follow up visit.    Needs labs (CMP) also.

## 2015-04-17 NOTE — Telephone Encounter (Addendum)
The patient last received this medication, Lisinopril at the requesting pharmacy on 03/21/2015 #90    The patient last received this medication, Xarelto at the requesting pharmacy on 03/16/2015 #90

## 2015-04-20 NOTE — Telephone Encounter (Signed)
Confirmed appointment for 3/30 with Dr Jabier Mutton. Also confirmed 2/8 appointment with Dr Encarnacion Chu.

## 2015-05-02 ENCOUNTER — Ambulatory Visit (HOSPITAL_BASED_OUTPATIENT_CLINIC_OR_DEPARTMENT_OTHER): Payer: Medicare Other | Attending: Endocrinology | Admitting: Endocrinology

## 2015-05-02 ENCOUNTER — Encounter (HOSPITAL_BASED_OUTPATIENT_CLINIC_OR_DEPARTMENT_OTHER): Payer: Self-pay | Admitting: Endocrinology

## 2015-05-02 VITALS — BP 134/65 | HR 54 | Temp 97.3°F | Resp 16 | Ht 70.0 in | Wt 179.9 lb

## 2015-05-02 DIAGNOSIS — E032 Hypothyroidism due to medicaments and other exogenous substances: Secondary | ICD-10-CM | POA: Insufficient documentation

## 2015-05-02 DIAGNOSIS — T462X1A Poisoning by other antidysrhythmic drugs, accidental (unintentional), initial encounter: Secondary | ICD-10-CM | POA: Insufficient documentation

## 2015-05-02 NOTE — Progress Notes (Signed)
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 Will plan to check thyroid labs every 6 months. He can have these done when he is here for his cardiology follow up visits, and I will follow up on these labs.  Will get repeat labs with his next cardiology appointment. These labs are ordered.      ---    Follow up: as needed based on follow up labs      Subjective:     History of Present Illness:     Mark Poole has a history of atrial fibrillation, heart failure and hypertension treated with amiodarone. He developed amiodarone induced hypothyroidism in Jan 2016 after being on amiodarone for about a year. Has been asymptomatic with this.     Interval History:  Patient was last seen in clinic April 2016.  At that time, was doing well but TSH was suppressed so we decreased his levothyroxine. Labs in Oct were in the normal range. He continues to well, feels fine. Denies weight changes, tremor, heat or cold intolerance, shortness of breath worse than his baseline. He does have chest pain with exertion but this is unchanged from his usual state of health. He has had constipation over the last year, has treated with prunes.     Current endocrine medications:  Levothyroxine 100 mcg daily    Other meds:  Amiodarone 200 mg daily  Atorvastatin 80 mg daily  Lisinopril 20 mg daily  Metroprolol 50 mg daily  Rivaroxaban 20 mg daily  Spironolactone 25 mg daily    Additional History Details:  I reviewed the patient's recorded medical history, and updated as appropriate the medications, problem list and allergies with the patient.        Objective:     BP 134/65 mmHg  Pulse 54  Temp(Src) 97.3 F (36.3 C) (Temporal)  Resp 16  Ht 5\' 10"  (1.778 m)  Wt 179 lb 14.3 oz (81.6 kg)  BMI 25.81 kg/m2  SpO2 100%    Exam:    Physical Exam   Constitutional: He is  oriented to person, place, and time. He appears well-developed. No distress.   Cardiovascular: Normal rate, regular rhythm and intact distal pulses.    Pulmonary/Chest: Effort normal. No respiratory distress.   Neurological: He is alert and oriented to person, place, and time.   Skin: Skin is warm and dry.   Psychiatric: He has a normal mood and affect. Judgment normal.         Supplemental Information:  Labs:  THYROID STIMULATING HORMONE   Date Value Ref Range Status   01/10/2015 0.503 0.400 - 5.000 u[IU]/mL Final     THYROXINE (FREE)   Date Value Ref Range Status   01/10/2015 1.3* 0.6 - 1.2 ng/dL Final     TRIIODOTHYRONINE (T3)   Date Value Ref Range Status   01/10/2015 76 73 - 178 ng/dL Final

## 2015-05-15 ENCOUNTER — Other Ambulatory Visit: Payer: Self-pay | Admitting: Internal Medicine

## 2015-05-15 DIAGNOSIS — I5020 Unspecified systolic (congestive) heart failure: Secondary | ICD-10-CM

## 2015-05-15 NOTE — Telephone Encounter (Signed)
The patient last received this medication at the requesting pharmacy on 04/04/2015 #90

## 2015-05-16 MED ORDER — METOPROLOL SUCCINATE ER 50 MG OR TB24
50.0000 mg | EXTENDED_RELEASE_TABLET | Freq: Every day | ORAL | Status: DC
Start: 2015-05-16 — End: 2015-06-21

## 2015-05-17 ENCOUNTER — Other Ambulatory Visit: Payer: Self-pay | Admitting: Internal Medicine

## 2015-05-17 DIAGNOSIS — I5020 Unspecified systolic (congestive) heart failure: Secondary | ICD-10-CM

## 2015-05-17 DIAGNOSIS — I10 Essential (primary) hypertension: Secondary | ICD-10-CM

## 2015-05-18 MED ORDER — SPIRONOLACTONE 25 MG OR TABS
ORAL_TABLET | ORAL | Status: DC
Start: 2015-05-18 — End: 2015-06-21

## 2015-06-04 ENCOUNTER — Other Ambulatory Visit (HOSPITAL_BASED_OUTPATIENT_CLINIC_OR_DEPARTMENT_OTHER): Payer: Self-pay | Admitting: Endocrinology

## 2015-06-04 DIAGNOSIS — E032 Hypothyroidism due to medicaments and other exogenous substances: Secondary | ICD-10-CM

## 2015-06-04 DIAGNOSIS — T462X1A Poisoning by other antidysrhythmic drugs, accidental (unintentional), initial encounter: Secondary | ICD-10-CM

## 2015-06-14 ENCOUNTER — Other Ambulatory Visit: Payer: Self-pay | Admitting: Internal Medicine

## 2015-06-14 DIAGNOSIS — I482 Chronic atrial fibrillation, unspecified: Secondary | ICD-10-CM

## 2015-06-14 MED ORDER — ATORVASTATIN CALCIUM 80 MG OR TABS
80.0000 mg | ORAL_TABLET | Freq: Every day | ORAL | Status: DC
Start: 2015-06-14 — End: 2015-06-21

## 2015-06-14 NOTE — Telephone Encounter (Signed)
The patient last received this medication at the requesting pharmacy on 05/17/2015 #90

## 2015-06-21 ENCOUNTER — Ambulatory Visit
Admit: 2015-06-21 | Discharge: 2015-06-21 | Disposition: A | Discharge status: Home / Self Care | Payer: Medicare Other | Attending: Endocrinology | Admitting: Endocrinology

## 2015-06-21 ENCOUNTER — Other Ambulatory Visit (HOSPITAL_BASED_OUTPATIENT_CLINIC_OR_DEPARTMENT_OTHER): Payer: Medicare Other | Admitting: Endocrinology

## 2015-06-21 ENCOUNTER — Ambulatory Visit (HOSPITAL_BASED_OUTPATIENT_CLINIC_OR_DEPARTMENT_OTHER): Payer: Medicare Other | Attending: Internal Medicine | Admitting: Internal Medicine

## 2015-06-21 ENCOUNTER — Encounter (HOSPITAL_BASED_OUTPATIENT_CLINIC_OR_DEPARTMENT_OTHER): Payer: Self-pay | Admitting: Internal Medicine

## 2015-06-21 VITALS — BP 142/76 | HR 50 | Temp 98.1°F | Wt 176.6 lb

## 2015-06-21 DIAGNOSIS — E032 Hypothyroidism due to medicaments and other exogenous substances: Secondary | ICD-10-CM

## 2015-06-21 DIAGNOSIS — T462X1A Poisoning by other antidysrhythmic drugs, accidental (unintentional), initial encounter: Secondary | ICD-10-CM

## 2015-06-21 DIAGNOSIS — I10 Essential (primary) hypertension: Secondary | ICD-10-CM | POA: Insufficient documentation

## 2015-06-21 DIAGNOSIS — I480 Paroxysmal atrial fibrillation: Secondary | ICD-10-CM | POA: Insufficient documentation

## 2015-06-21 DIAGNOSIS — I482 Chronic atrial fibrillation, unspecified: Secondary | ICD-10-CM

## 2015-06-21 DIAGNOSIS — I5022 Chronic systolic (congestive) heart failure: Secondary | ICD-10-CM | POA: Insufficient documentation

## 2015-06-21 DIAGNOSIS — I350 Nonrheumatic aortic (valve) stenosis: Secondary | ICD-10-CM | POA: Insufficient documentation

## 2015-06-21 DIAGNOSIS — I5020 Unspecified systolic (congestive) heart failure: Secondary | ICD-10-CM | POA: Insufficient documentation

## 2015-06-21 LAB — THYROID STIMULATING HORMONE: Thyroid Stimulating Hormone: 0.728 u[IU]/mL (ref 0.400–5.000)

## 2015-06-21 LAB — T4, FREE: Thyroxine (Free): 1.3 ng/dL — ABNORMAL HIGH (ref 0.6–1.2)

## 2015-06-21 LAB — T3: Triiodothyronine (T3): 65 ng/dL — ABNORMAL LOW (ref 73–178)

## 2015-06-21 MED ORDER — SPIRONOLACTONE 25 MG OR TABS
25.0000 mg | ORAL_TABLET | Freq: Every day | ORAL | Status: DC
Start: 2015-06-21 — End: 2016-06-23

## 2015-06-21 MED ORDER — METOPROLOL SUCCINATE ER 50 MG OR TB24
50.0000 mg | EXTENDED_RELEASE_TABLET | Freq: Every day | ORAL | Status: DC
Start: 2015-06-21 — End: 2015-10-01

## 2015-06-21 MED ORDER — AMIODARONE HCL 200 MG OR TABS
200.0000 mg | ORAL_TABLET | Freq: Every day | ORAL | Status: DC
Start: 2015-06-21 — End: 2016-06-22

## 2015-06-21 MED ORDER — RIVAROXABAN 20 MG OR TABS
20.0000 mg | ORAL_TABLET | Freq: Every day | ORAL | Status: DC
Start: 2015-06-21 — End: 2015-10-04

## 2015-06-21 MED ORDER — ATORVASTATIN CALCIUM 80 MG OR TABS
80.0000 mg | ORAL_TABLET | Freq: Every day | ORAL | Status: DC
Start: 2015-06-21 — End: 2016-11-23

## 2015-06-21 MED ORDER — LISINOPRIL 20 MG OR TABS
40.0000 mg | ORAL_TABLET | Freq: Every day | ORAL | Status: DC
Start: 2015-06-21 — End: 2016-06-23

## 2015-06-21 NOTE — Patient Instructions (Addendum)
Increase lisinopril from 20 to 40 mg daily.    Continue other current medications - refilled today.    We will schedule you for a treadmill echocardiogram to evaluate your aortic stenosis and heart function.    Start walking daily for exercise at least 30-45 minutes.

## 2015-06-21 NOTE — Progress Notes (Signed)
OUTPATIENT VISIT    ID/CHIEF COMPLAINT: Mark Poole is a 65 year old male here for Atrial Fibrillation      HISTORY:  Denies chest pain shortness breath palpitations or lower extremity edema.  Adherent with medications.  No alcohol use.  Not much in the way of exercise recently.    REVIEW OF SYSTEMS:  A complete ROS was performed and is negative, except where noted in the above HPI.    PHYSICAL EXAM:  VITALS - Blood pressure 142/76, pulse 50, temperature 98.1 F (36.7 C), temperature source Temporal, weight 176 lb 9.6 oz (80.105 kg), SpO2 100 %.  GENERAL - well appearing no acute distress  HEENT - anicteric mucous membranes moist  RESPIRATORY - clear to auscultation bilaterally  CARDIOVASCULAR - regular rate and rhythm, normal S1-S2 with a 3/6 crescendo decrescendo murmur radiating to the carotids, loudest at the left upper sternal border, no rub or gallop, normal PMI without heave or thrill, 2+ radial and carotid pulses.  No jugular venous distention and no lower extremity edema.  ABDOMEN - soft with normal bowel tones  SKIN - warm dry pink  NEURO - face symmetric  MENTAL STATUS - alert and oriented    DIAGNOSTIC STUDIES:  T3 65, free thyroxine 1.3 today    ASSESSMENT/PLAN:  Mark Poole was seen today for atrial fibrillation, heart failure with reduced ejection fraction and recovery, aortic stenosis.  No cardiac symptoms and a cardiac exam only notable for his murmur of aortic stenosis.  On a appropriate medical therapy for prior heart failure thought to be tachycardia or alcohol related.  Doing well with rhythm control strategy for atrial fibrillation, in sinus today, and on appropriate levothyroxin for induced hypothyroidism.  He has follow-up with endocrinology later this year.    We will check surveillance echocardiogram for his mild aortic stenosis at this time.  We will do this as a treadmill echocardiogram to also evaluate his coronary arteries for significant stenosis given his history of risk factors  and chest radiation.    His last PFTs for amiodarone or in December 2015.  We will recheck PFTs at this time.    He should continue atorvastatin for lipid lowering for primary prevention.  He should continue amiodarone for heart failure with atrial fibrillation, resolved.  We could consider taking him off amiodarone and surveillance with rhythm monitor on next visit.  Continue her rivaroxaban for thromboembolism prophylaxis.    We will increase his lisinopril to 40 mg daily or if he does not tolerate this 20 mg twice a day in order to target a systolic blood pressure as close to 121 m of mercury as possible.    Diagnoses and all orders for this visit:    Systolic heart failure, unspecified heart failure chronicity (HCC)  -     Spironolactone 25 MG Oral Tab; Take 1 tablet (25 mg) by mouth daily.  -     Metoprolol Succinate ER 50 MG Oral TABLET SR 24 HR; Take 1 tablet (50 mg) by mouth daily. Do not chew or crush.  -     REFERRAL TO CARDIAC STRESS TEST    Paroxysmal atrial fibrillation (HCC)  -     Rivaroxaban 20 MG Oral Tab; Take 1 tablet (20 mg) by mouth daily.  -     Amiodarone HCl 200 MG Oral Tab; Take 1 tablet (200 mg) by mouth daily.    Essential hypertension  -     Lisinopril 20 MG Oral Tab; Take 2 tablets (  40 mg) by mouth daily.    Chronic atrial fibrillation (HCC)  -     Atorvastatin Calcium 80 MG Oral Tab; Take 1 tablet (80 mg) by mouth daily.    Chronic systolic heart failure (HCC)  -     Amiodarone HCl 200 MG Oral Tab; Take 1 tablet (200 mg) by mouth daily.    Nonrheumatic aortic valve stenosis  -     REFERRAL TO CARDIAC STRESS TEST          Return in about 1 year (around 06/20/2016). to evaluate for recurrence of heart failure, tolerance of thromboembolism prophylaxis, and consideration of switching from amiodarone to a different agent or surveillance.        ------------------------------------------------------------------------------------------------------------------  SUPPLEMENTAL INFO:    PROBLEM  LIST:  Patient Active Problem List    Diagnosis Date Noted   . Aortic stenosis 08/17/2014     mild     . 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 HTN (hypertension) 03/02/2014   . NHL (non-Hodgkin's lymphoma) ([place]) 03/03/2013     In lungs  Radiation therapy     . 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 HFrEF (heart failure with reduced ejection fraction) ([place]) 09/03/2012     07/08/12 TTE: EF 36%.Left ventricle is normal in size and thickness. Moderate global hypokinesis. Right ventricle is normal in size with moderate systolic dysfunction. Mild-moderate tricuspid regurgitation: estimated pulmonary artery systolic pressures are normal (29-34 mmHg).  Moderate mitral regurgitation.Trileaflet aortic valve with mild valvular stenosis and trace regurgitation.No pericardial effusion       . Left atrial thrombus 08/30/2012     08/30/12. TEE. Small thrombus in the left atrial appendage. A bright immobile structure in the appendage is again visualized, possibly representing a pectinate muscle vs calcified thrombus. Some views show an aspect of this mass that appears consistent with mobile thrombus. There is spontaneous echo contrast in the appendage and left atrium. Cardioversion was thus deferred.          . Atrial fibrillation ([place]) 08/19/2012        SOCIAL HISTORY:  Social History     Social History   . Marital Status: Single     Spouse Name: N/A   . Number of Children: N/A   . Years of Education: N/A     Occupational History   . Not on file.     Social History Main Topics   . Smoking status: Former Smoker -- 1.00 packs/day for 20 years     Types: Cigarettes   . Smokeless tobacco: Not on file   . Alcohol Use: No   . Drug Use: No   . Sexual Activity: Not on file     Other Topics Concern   . Not on file     Social History Narrative       CURRENT MEDICATIONS:  as of end of visit on 06/21/2015    Current Outpatient Prescriptions   Medication Sig Dispense Refill   . Amiodarone HCl 200 MG Oral Tab Take 1 tablet (200 mg) by  mouth daily. 65 tablet 12   . Atorvastatin Calcium 80 MG Oral Tab Take 1 tablet (80 mg) by mouth daily. 90 tablet 12   . Levothyroxine Sodium 100 MCG Oral Tab Take 1 tablet (100 mcg) by mouth daily. 90 tablet 3   . Lisinopril 20 MG Oral Tab Take 2 tablets (40 mg) by mouth daily. 90 tablet 12   . Metoprolol Succinate ER 50 MG Oral  TABLET SR 24 HR Take 1 tablet (50 mg) by mouth daily. Do not chew or crush. 90 tablet 12   . Rivaroxaban 20 MG Oral Tab Take 1 tablet (20 mg) by mouth daily. 90 tablet 12   . Spironolactone 25 MG Oral Tab Take 1 tablet (25 mg) by mouth daily. 30 tablet 12     No current facility-administered medications for this visit.       ALLERGIES:  Review of patient's allergies indicates:  No Known Allergies     VITAL SIGN TRENDS:       Wt Readings from Last 5 Encounters:   06/21/15 176 lb 9.6 oz (80.105 kg)   05/02/15 179 lb 14.3 oz (81.6 kg)   08/17/14 172 lb 9.6 oz (78.291 kg)   07/12/14 172 lb (78.019 kg)   05/11/14 180 lb 11.2 oz (81.965 kg)          BP Readings from Last 5 Encounters:   06/21/15 142/76   05/02/15 134/65   08/17/14 150/74   07/12/14 123/77   05/11/14 122/74        STUDIES:  Orders Only on 01/10/2015   Component Date Value Ref Range Status   . Triiodothyronine (T3) 01/10/2015 76  73 - 178 ng/dL Final   . Thyroxine (Free) 01/10/2015 1.3* 0.6 - 1.2 ng/dL Final   . Thyroid Stimulating Hormone 01/10/2015 0.503  0.400 - 5.000 u[IU]/mL Final

## 2015-08-07 ENCOUNTER — Other Ambulatory Visit: Payer: Self-pay | Admitting: Internal Medicine

## 2015-08-07 DIAGNOSIS — I480 Paroxysmal atrial fibrillation: Secondary | ICD-10-CM

## 2015-08-10 ENCOUNTER — Ambulatory Visit (HOSPITAL_BASED_OUTPATIENT_CLINIC_OR_DEPARTMENT_OTHER): Payer: Medicare Other

## 2015-08-13 IMAGING — CR DG CHEST 2V
2 series · 2 of 2 positions shown · non-contrast
Comparison: None.

CLINICAL DATA: Cough and congestion.  History of smoking.

EXAM:
CHEST  2 VIEW

[w chest pa]
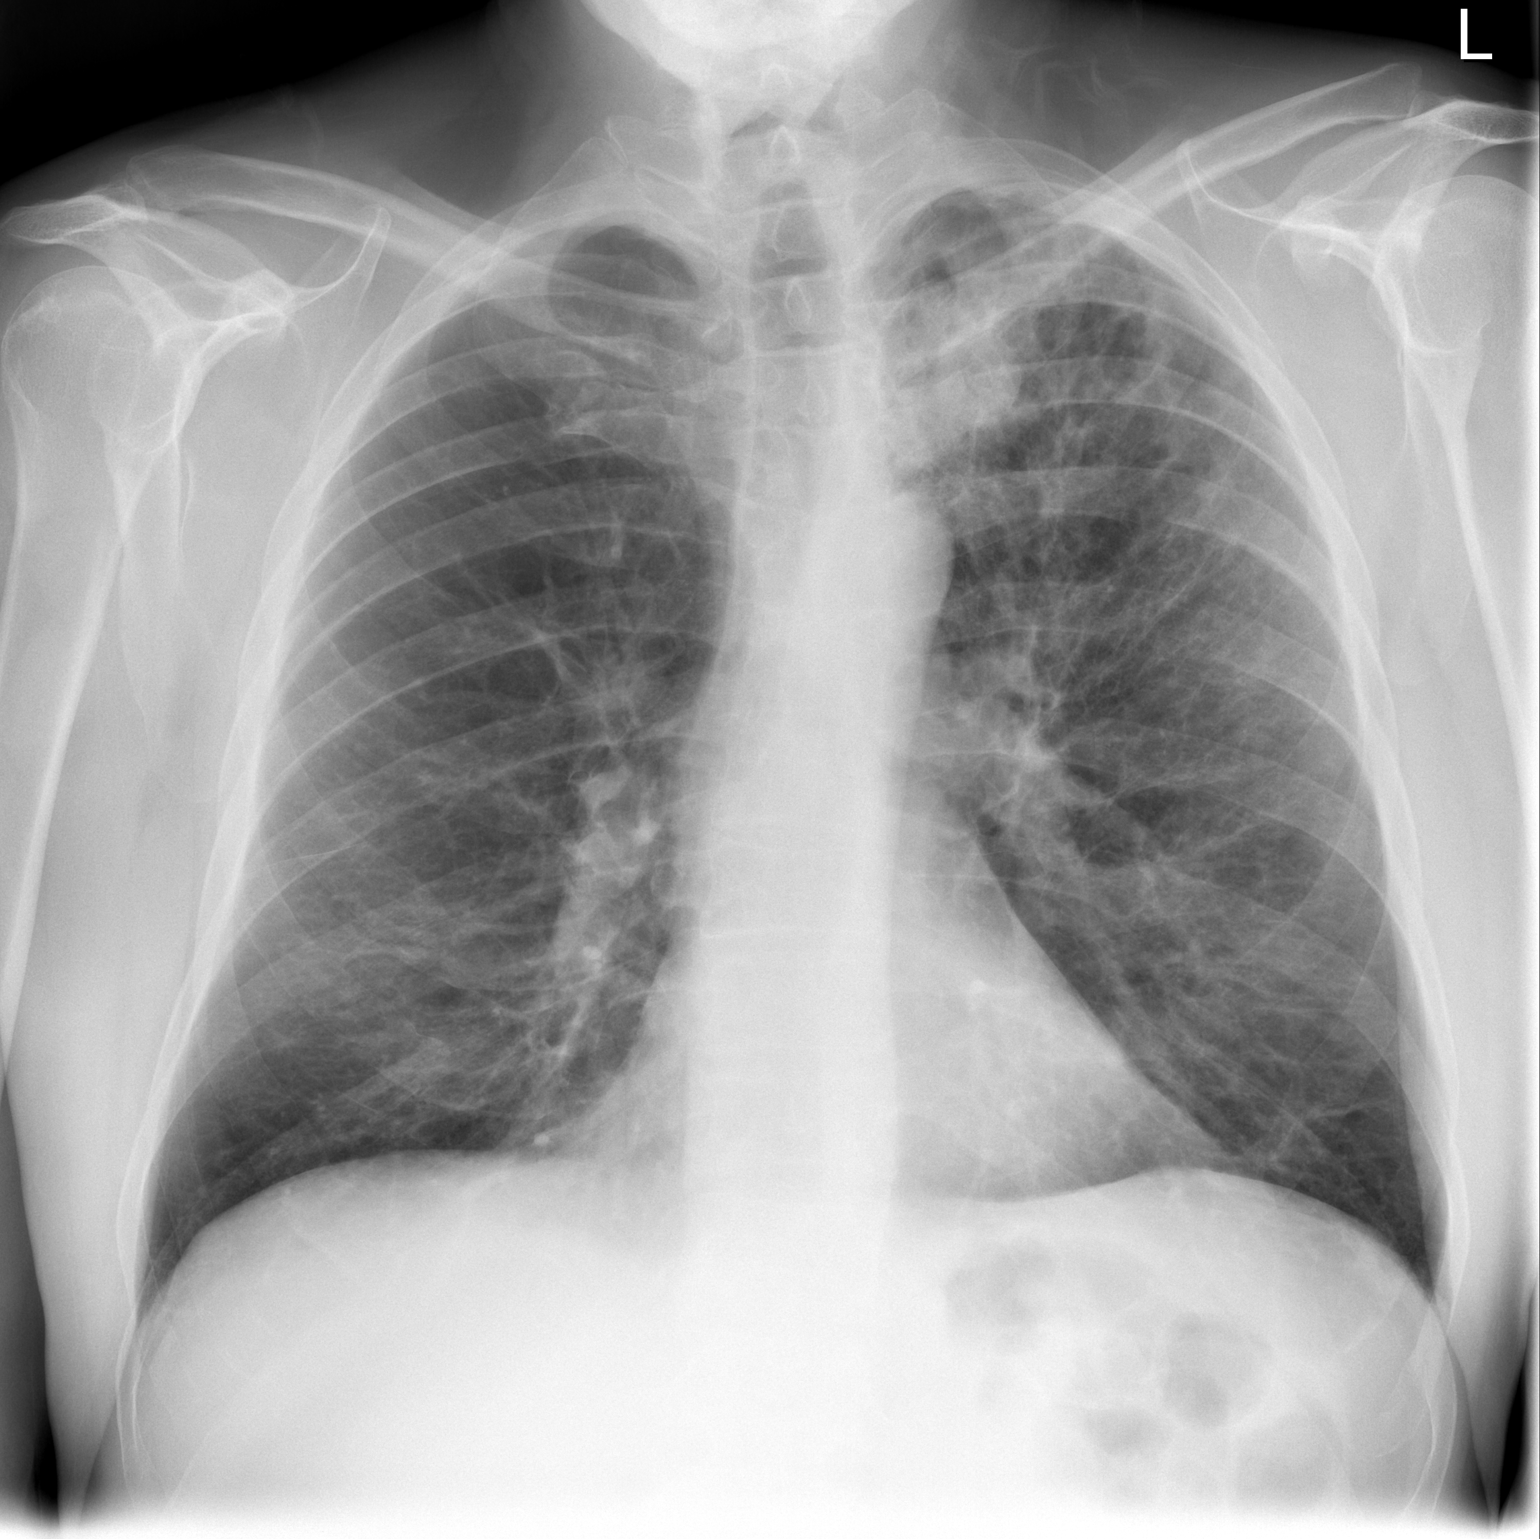

[w chest lat]
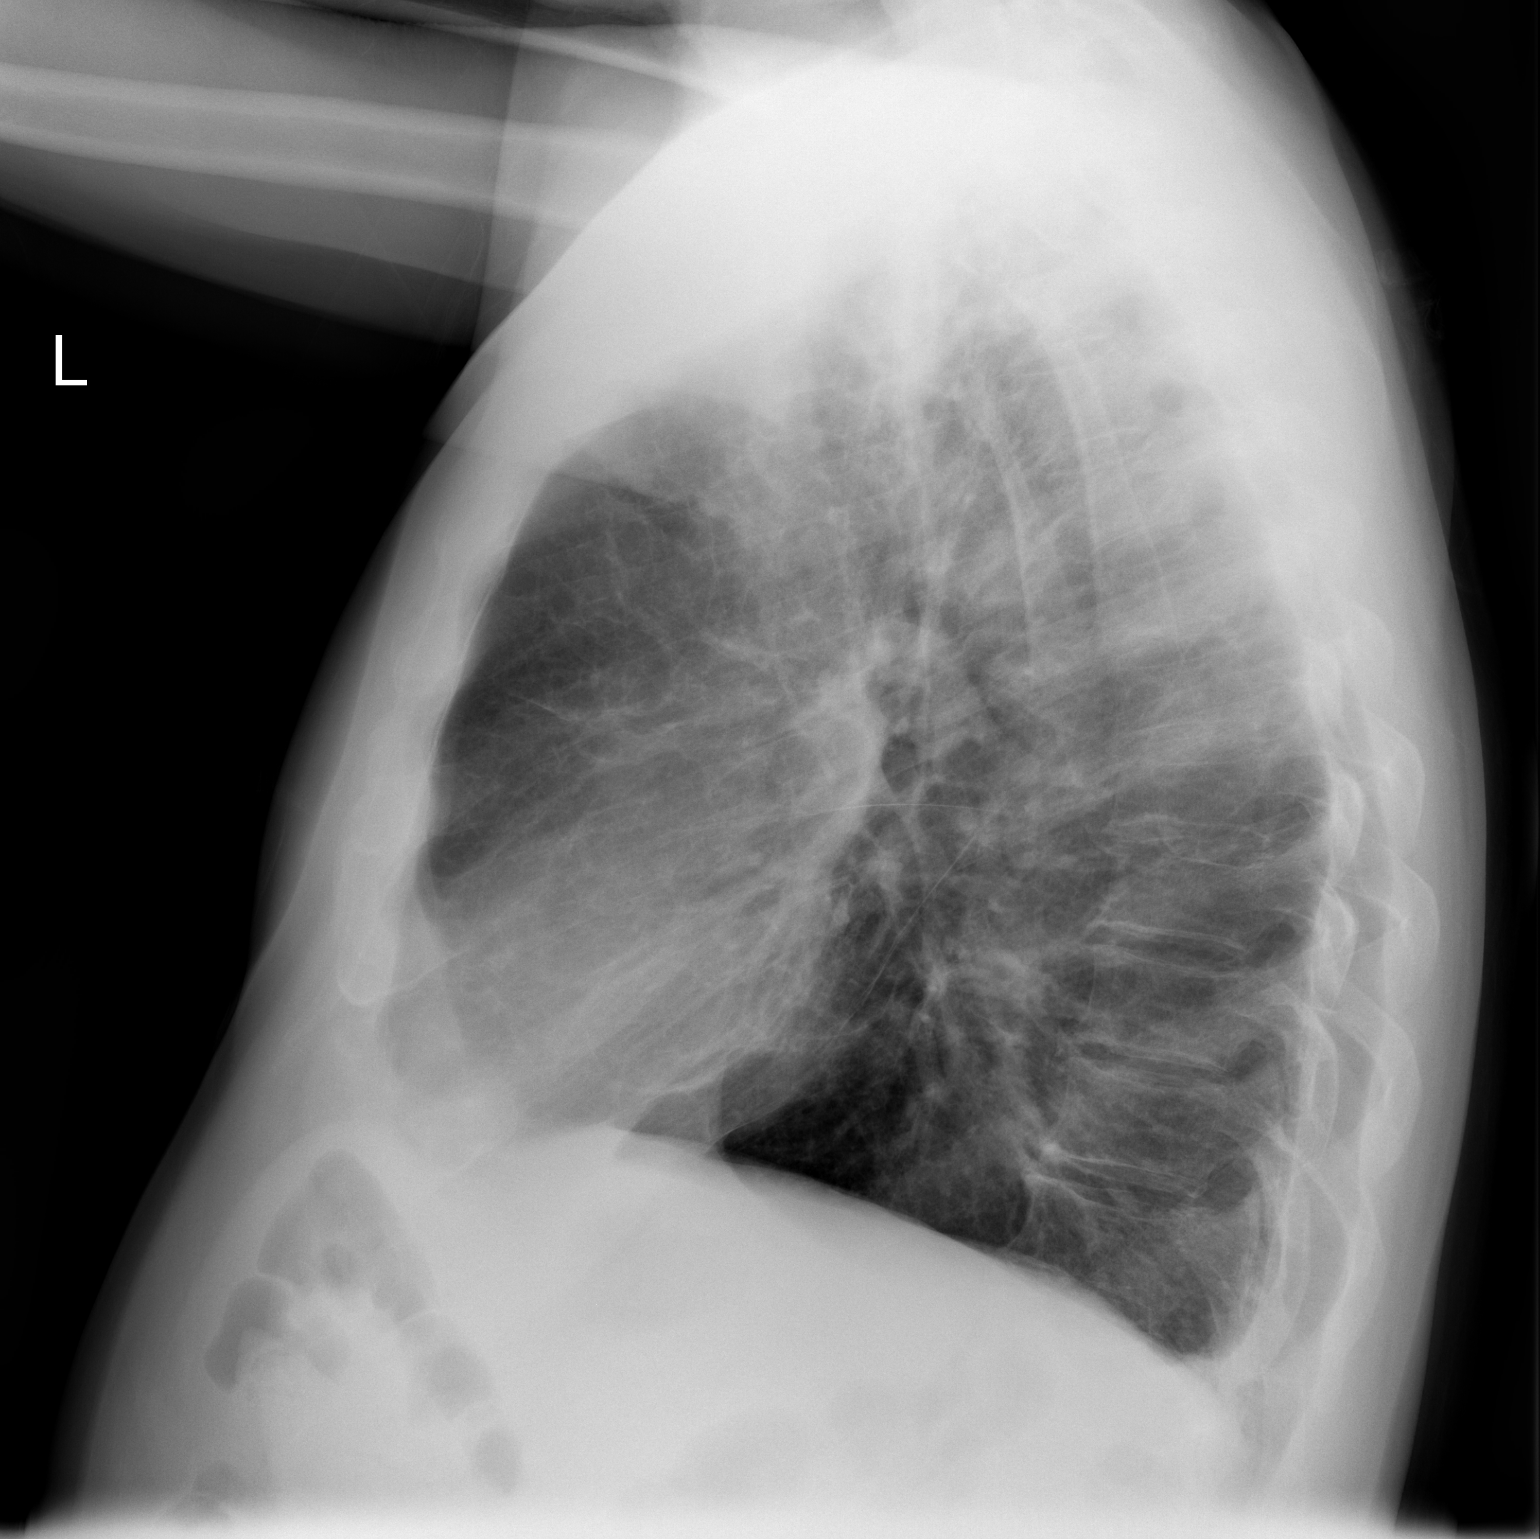

[2 of 2 positions shown; findings below may reference images not displayed]

FINDINGS: The lungs are well-aerated. Patchy left-sided airspace opacity
raises concern for pneumonia. There is no evidence of pleural
effusion or pneumothorax.

The heart is normal in size; the mediastinal contour is within
normal limits. No acute osseous abnormalities are seen.
IMPRESSION: Patchy left-sided airspace opacity raises concern for pneumonia.

## 2015-08-27 ENCOUNTER — Ambulatory Visit (HOSPITAL_BASED_OUTPATIENT_CLINIC_OR_DEPARTMENT_OTHER): Payer: Medicare Other

## 2015-10-01 ENCOUNTER — Other Ambulatory Visit: Payer: Self-pay | Admitting: Internal Medicine

## 2015-10-01 ENCOUNTER — Other Ambulatory Visit (HOSPITAL_BASED_OUTPATIENT_CLINIC_OR_DEPARTMENT_OTHER): Payer: Self-pay | Admitting: Endocrinology

## 2015-10-01 DIAGNOSIS — I5020 Unspecified systolic (congestive) heart failure: Secondary | ICD-10-CM

## 2015-10-01 DIAGNOSIS — E032 Hypothyroidism due to medicaments and other exogenous substances: Secondary | ICD-10-CM

## 2015-10-01 NOTE — Telephone Encounter (Signed)
The patient last received this medication at the requesting pharmacy on 07/12/2015 #90

## 2015-10-02 MED ORDER — METOPROLOL SUCCINATE ER 50 MG OR TB24
50.0000 mg | EXTENDED_RELEASE_TABLET | Freq: Every day | ORAL | 1 refills | Status: DC
Start: 2015-10-02 — End: 2016-07-21

## 2015-10-02 MED ORDER — LEVOTHYROXINE SODIUM 100 MCG OR TABS
ORAL_TABLET | ORAL | 0 refills | Status: DC
Start: 2015-10-02 — End: 2015-12-31

## 2015-10-04 ENCOUNTER — Telehealth (HOSPITAL_BASED_OUTPATIENT_CLINIC_OR_DEPARTMENT_OTHER): Payer: Self-pay | Admitting: Internal Medicine

## 2015-10-04 ENCOUNTER — Other Ambulatory Visit (HOSPITAL_BASED_OUTPATIENT_CLINIC_OR_DEPARTMENT_OTHER): Payer: Self-pay | Admitting: Internal Medicine

## 2015-10-04 DIAGNOSIS — I480 Paroxysmal atrial fibrillation: Secondary | ICD-10-CM

## 2015-10-04 MED ORDER — RIVAROXABAN 20 MG OR TABS
20.0000 mg | ORAL_TABLET | Freq: Every day | ORAL | 12 refills | Status: DC
Start: 2015-10-04 — End: 2016-06-23

## 2015-10-04 NOTE — Telephone Encounter (Signed)
Can you print and sign this and I will fax it for you?  Mark Poole

## 2015-10-04 NOTE — Telephone Encounter (Signed)
(  TEXTING IS AN OPTION FOR [facility] ONLY)  Is this a [facility] clinic? No      RETURN CALL: Detailed message on voicemail only      SUBJECT:  Refill Request    MEDICATION(S): Rivaroxaban  NEEDEasapD BY: asap  PRESCRIBING PROVIDER: Dr Jabier Mutton  PHARMACY NAME AND LOCATION: [email or web address]   [place] PHONE: [phone]  PHARMACY FAX NUMBER: [phone]  ADDITIONAL INFORMATION: Patient is requesting a hard copy be faxed to the above fax. Thank you

## 2015-10-04 NOTE — Telephone Encounter (Signed)
Printed and signed by Dr. Jabier Mutton. Faxed to requested pharmacy at 3:33 pm. Received confirmation of fax receipt at 3:34 pm.

## 2015-12-31 ENCOUNTER — Other Ambulatory Visit (HOSPITAL_BASED_OUTPATIENT_CLINIC_OR_DEPARTMENT_OTHER): Payer: Self-pay | Admitting: Endocrinology

## 2015-12-31 DIAGNOSIS — E032 Hypothyroidism due to medicaments and other exogenous substances: Secondary | ICD-10-CM

## 2016-01-01 MED ORDER — LEVOTHYROXINE SODIUM 100 MCG OR TABS
ORAL_TABLET | ORAL | 0 refills | Status: DC
Start: 2016-01-01 — End: 2016-04-07

## 2016-01-01 NOTE — Telephone Encounter (Signed)
Refill request received for Levothyroxine has been approved. However last thyroid labs were 06/21/15 with plan for follow-up in 6 months.  "Will plan to check thyroid labs every 6 months. He can have these done when he is here for his cardiology follow up visits".  Cardiology follow-up not due until 05/2016    Forward to provider to review.

## 2016-01-02 ENCOUNTER — Encounter (HOSPITAL_BASED_OUTPATIENT_CLINIC_OR_DEPARTMENT_OTHER): Payer: Self-pay | Admitting: Endocrinology

## 2016-01-14 ENCOUNTER — Telehealth (HOSPITAL_BASED_OUTPATIENT_CLINIC_OR_DEPARTMENT_OTHER): Payer: Self-pay | Admitting: Endocrinology

## 2016-01-14 NOTE — Telephone Encounter (Signed)
Following up unread [facility]Care message sent 01/02/2016:  "Hi Mr. Magiera,   I wanted to up date your thyroid labs, and was wondering if you would be able to have those done sometime in the next month? If the [place] is convenient for you, I can order labs to be done at any  lab. If you would prefer to have them done at another lab, let me know and I can send the orders there.   Thanks! Hope you are doing well.     Desmond Dike, MD   [facility]   [phone] "

## 2016-01-15 NOTE — Telephone Encounter (Signed)
Left message for patient with request to call RN.

## 2016-01-17 NOTE — Telephone Encounter (Signed)
Left third message with request to call RN.

## 2016-01-18 NOTE — Telephone Encounter (Signed)
Left message requesting patient have labs drawn.  Clinic phone number provided for follow up questions as needed.

## 2016-04-07 ENCOUNTER — Other Ambulatory Visit (HOSPITAL_BASED_OUTPATIENT_CLINIC_OR_DEPARTMENT_OTHER): Payer: Self-pay | Admitting: Endocrinology

## 2016-04-07 DIAGNOSIS — E032 Hypothyroidism due to medicaments and other exogenous substances: Secondary | ICD-10-CM

## 2016-04-08 MED ORDER — LEVOTHYROXINE SODIUM 100 MCG OR TABS
ORAL_TABLET | ORAL | 0 refills | Status: DC
Start: 2016-04-08 — End: 2016-07-04

## 2016-06-22 ENCOUNTER — Other Ambulatory Visit (HOSPITAL_BASED_OUTPATIENT_CLINIC_OR_DEPARTMENT_OTHER): Payer: Self-pay | Admitting: Internal Medicine

## 2016-06-22 DIAGNOSIS — I480 Paroxysmal atrial fibrillation: Secondary | ICD-10-CM

## 2016-06-22 DIAGNOSIS — I5022 Chronic systolic (congestive) heart failure: Secondary | ICD-10-CM

## 2016-06-22 DIAGNOSIS — I10 Essential (primary) hypertension: Secondary | ICD-10-CM

## 2016-06-22 DIAGNOSIS — I5020 Unspecified systolic (congestive) heart failure: Secondary | ICD-10-CM

## 2016-06-23 ENCOUNTER — Other Ambulatory Visit (HOSPITAL_BASED_OUTPATIENT_CLINIC_OR_DEPARTMENT_OTHER): Payer: Self-pay | Admitting: Internal Medicine

## 2016-06-23 ENCOUNTER — Other Ambulatory Visit (HOSPITAL_BASED_OUTPATIENT_CLINIC_OR_DEPARTMENT_OTHER): Payer: Self-pay | Admitting: Registered Nurse

## 2016-06-23 DIAGNOSIS — I5022 Chronic systolic (congestive) heart failure: Secondary | ICD-10-CM

## 2016-06-23 DIAGNOSIS — I480 Paroxysmal atrial fibrillation: Secondary | ICD-10-CM

## 2016-06-23 DIAGNOSIS — I10 Essential (primary) hypertension: Secondary | ICD-10-CM

## 2016-06-23 DIAGNOSIS — I5020 Unspecified systolic (congestive) heart failure: Secondary | ICD-10-CM

## 2016-06-23 MED ORDER — XARELTO 20 MG OR TABS
ORAL_TABLET | ORAL | 0 refills | Status: DC
Start: 2016-06-23 — End: 2016-07-31

## 2016-06-23 MED ORDER — AMIODARONE HCL 200 MG OR TABS
ORAL_TABLET | ORAL | 0 refills | Status: DC
Start: 2016-06-23 — End: 2016-07-31

## 2016-06-23 MED ORDER — LISINOPRIL 20 MG OR TABS
ORAL_TABLET | ORAL | 0 refills | Status: DC
Start: 2016-06-23 — End: 2016-07-31

## 2016-06-23 MED ORDER — SPIRONOLACTONE 25 MG OR TABS
ORAL_TABLET | ORAL | 0 refills | Status: DC
Start: 2016-06-23 — End: 2016-07-31

## 2016-06-23 NOTE — Telephone Encounter (Signed)
I called the patient and left detailed message on his VM.    CCR: If patient calls back please with Dr. April Manson next available and advise him to stop by the lab prior to his appointment.

## 2016-06-23 NOTE — Telephone Encounter (Signed)
Per last visit note Return in about 1 year (around 06/20/2016). Rxs authorized one time, patient is overdue for CMP, please order lab work. Please schedule appt, kindly close this encounter once pt is informed or scheduled.

## 2016-07-04 ENCOUNTER — Other Ambulatory Visit: Payer: Self-pay | Admitting: Endocrinology

## 2016-07-04 DIAGNOSIS — E032 Hypothyroidism due to medicaments and other exogenous substances: Secondary | ICD-10-CM

## 2016-07-04 NOTE — Telephone Encounter (Signed)
05/02/15 [facility].   Hypothyroidism due to amiodarone  Doing well. Will plan to check thyroid labs every 6 months. He can have these done when he is here for his cardiology follow up visits, and I will follow up on these labs.  Will get repeat labs with his next cardiology appointment. These labs are ordered.

## 2016-07-04 NOTE — Telephone Encounter (Signed)
The patient last received this medication at the requesting pharmacy on 06/05/16 for quantity #30.

## 2016-07-04 NOTE — Telephone Encounter (Signed)
The requested medication requires your authorization because it is outside of the Refill Authorization Centers protocols:     Last TFTs found 06/21/15.  Per plan should be Q6Mo    If you want to authorize it please indicate refills, sign the order and close the encounter.    If this medication is denied please have your staff inform the patient and schedule an appointment if necessary.

## 2016-07-07 ENCOUNTER — Other Ambulatory Visit (HOSPITAL_BASED_OUTPATIENT_CLINIC_OR_DEPARTMENT_OTHER): Payer: Self-pay | Admitting: Endocrinology

## 2016-07-07 ENCOUNTER — Other Ambulatory Visit: Payer: Self-pay | Admitting: Endocrinology

## 2016-07-07 DIAGNOSIS — E032 Hypothyroidism due to medicaments and other exogenous substances: Secondary | ICD-10-CM

## 2016-07-07 DIAGNOSIS — T462X1A Poisoning by other antidysrhythmic drugs, accidental (unintentional), initial encounter: Secondary | ICD-10-CM

## 2016-07-07 MED ORDER — LEVOTHYROXINE SODIUM 100 MCG OR TABS
100.0000 ug | ORAL_TABLET | Freq: Every day | ORAL | 3 refills | Status: DC
Start: 2016-07-07 — End: 2016-07-07

## 2016-07-07 NOTE — Progress Notes (Signed)
Due for repeat thyroid labs and annual thyroid follow up.

## 2016-07-08 ENCOUNTER — Telehealth (HOSPITAL_BASED_OUTPATIENT_CLINIC_OR_DEPARTMENT_OTHER): Payer: Self-pay | Admitting: Endocrinology

## 2016-07-08 MED ORDER — LEVOTHYROXINE SODIUM 100 MCG OR TABS
ORAL_TABLET | ORAL | 0 refills | Status: DC
Start: 2016-07-08 — End: 2016-07-31

## 2016-07-08 NOTE — Telephone Encounter (Signed)
Called the patients primary number to inform him to go to the lab first, but there was no answer so I left him a voicemail explaining the reason for my call and clinic call back number

## 2016-07-08 NOTE — Telephone Encounter (Signed)
Genia Plants, MD  [facility] Clinical Support Staff Pool            Please let patient know to have thyroid labs drawn at his upcoming cardiology visit, and we need to schedule for endocrine follow up for his hypothyroidism. Can be sometime in the next 6 months. Thanks.

## 2016-07-08 NOTE — Telephone Encounter (Signed)
One refill authorized.  Please remind pt to come in for labs 07/17/16 when they see cardiology.  TFTs ordered

## 2016-07-08 NOTE — Telephone Encounter (Signed)
Message left for patient requesting he have TFT's done on 07/17/16 when at [facility] for cardiology visit.  Also RN requests patient call back to schedule endocrine visit for hypothyroid in the next 6 months.  Phone number provided for scheduling.  [facility] with these same requests sent as well.

## 2016-07-08 NOTE — Telephone Encounter (Signed)
**  Patient requests 90 days supply**    Resent 07/04/16 refill 90 day supply

## 2016-07-17 ENCOUNTER — Encounter (HOSPITAL_BASED_OUTPATIENT_CLINIC_OR_DEPARTMENT_OTHER): Payer: Medicare HMO | Admitting: Internal Medicine

## 2016-07-20 ENCOUNTER — Other Ambulatory Visit (HOSPITAL_BASED_OUTPATIENT_CLINIC_OR_DEPARTMENT_OTHER): Payer: Self-pay | Admitting: Internal Medicine

## 2016-07-20 DIAGNOSIS — I5020 Unspecified systolic (congestive) heart failure: Secondary | ICD-10-CM

## 2016-07-21 MED ORDER — METOPROLOL SUCCINATE ER 50 MG OR TB24
EXTENDED_RELEASE_TABLET | ORAL | 0 refills | Status: DC
Start: 2016-07-21 — End: 2016-07-31

## 2016-07-31 ENCOUNTER — Ambulatory Visit (HOSPITAL_BASED_OUTPATIENT_CLINIC_OR_DEPARTMENT_OTHER): Payer: Medicare HMO | Attending: Internal Medicine | Admitting: Internal Medicine

## 2016-07-31 ENCOUNTER — Other Ambulatory Visit (HOSPITAL_BASED_OUTPATIENT_CLINIC_OR_DEPARTMENT_OTHER): Payer: Self-pay | Admitting: Internal Medicine

## 2016-07-31 ENCOUNTER — Encounter (HOSPITAL_BASED_OUTPATIENT_CLINIC_OR_DEPARTMENT_OTHER): Payer: Self-pay | Admitting: Internal Medicine

## 2016-07-31 ENCOUNTER — Other Ambulatory Visit (HOSPITAL_BASED_OUTPATIENT_CLINIC_OR_DEPARTMENT_OTHER): Payer: Self-pay | Admitting: Registered Nurse

## 2016-07-31 VITALS — BP 109/55 | HR 56 | Temp 97.9°F | Ht 70.0 in | Wt 181.0 lb

## 2016-07-31 DIAGNOSIS — I5020 Unspecified systolic (congestive) heart failure: Secondary | ICD-10-CM

## 2016-07-31 DIAGNOSIS — T462X1A Poisoning by other antidysrhythmic drugs, accidental (unintentional), initial encounter: Secondary | ICD-10-CM | POA: Insufficient documentation

## 2016-07-31 DIAGNOSIS — I480 Paroxysmal atrial fibrillation: Secondary | ICD-10-CM | POA: Insufficient documentation

## 2016-07-31 DIAGNOSIS — I10 Essential (primary) hypertension: Secondary | ICD-10-CM

## 2016-07-31 DIAGNOSIS — I5022 Chronic systolic (congestive) heart failure: Secondary | ICD-10-CM | POA: Insufficient documentation

## 2016-07-31 DIAGNOSIS — I350 Nonrheumatic aortic (valve) stenosis: Secondary | ICD-10-CM

## 2016-07-31 DIAGNOSIS — E032 Hypothyroidism due to medicaments and other exogenous substances: Secondary | ICD-10-CM | POA: Insufficient documentation

## 2016-07-31 LAB — COMPREHENSIVE METABOLIC PANEL
ALT (GPT): 16 U/L (ref 10–48)
AST (GOT): 14 U/L (ref 9–38)
Albumin: 4 g/dL (ref 3.5–5.2)
Alkaline Phosphatase (Total): 49 U/L (ref 36–161)
Anion Gap: 8 (ref 4–12)
Bilirubin (Total): 0.3 mg/dL (ref 0.2–1.3)
Calcium: 9.1 mg/dL (ref 8.9–10.2)
Carbon Dioxide, Total: 24 meq/L (ref 22–32)
Chloride: 108 meq/L (ref 98–108)
Creatinine: 2.03 mg/dL — ABNORMAL HIGH (ref 0.51–1.18)
GFR, Calc, African American: 40 mL/min/{1.73_m2} — ABNORMAL LOW (ref >59)
GFR, Calc, European American: 33 mL/min/{1.73_m2} — ABNORMAL LOW (ref >59)
Glucose: 95 mg/dL (ref 62–125)
Potassium: 5.1 meq/L (ref 3.6–5.2)
Protein (Total): 7.3 g/dL (ref 6.0–8.2)
Sodium: 140 meq/L (ref 135–145)
Urea Nitrogen: 28 mg/dL — ABNORMAL HIGH (ref 8–21)

## 2016-07-31 LAB — THYROID STIMULATING HORMONE: Thyroid Stimulating Hormone: 1.238 u[IU]/mL (ref 0.400–5.000)

## 2016-07-31 LAB — T3: Triiodothyronine (T3): 73 ng/dL (ref 73–178)

## 2016-07-31 LAB — T4, FREE: Thyroxine (Free): 1.2 ng/dL (ref 0.6–1.2)

## 2016-07-31 MED ORDER — METOPROLOL SUCCINATE ER 50 MG OR TB24
50.0000 mg | EXTENDED_RELEASE_TABLET | Freq: Every day | ORAL | 12 refills | Status: DC
Start: 2016-07-31 — End: 2016-10-17

## 2016-07-31 MED ORDER — SPIRONOLACTONE 25 MG OR TABS
ORAL_TABLET | ORAL | 12 refills | Status: DC
Start: 2016-07-31 — End: 2020-03-12

## 2016-07-31 MED ORDER — LEVOTHYROXINE SODIUM 100 MCG OR TABS
ORAL_TABLET | ORAL | 12 refills | Status: DC
Start: 2016-07-31 — End: 2017-10-16

## 2016-07-31 MED ORDER — AMIODARONE HCL 200 MG OR TABS
ORAL_TABLET | ORAL | 12 refills | Status: DC
Start: 2016-07-31 — End: 2016-08-26

## 2016-07-31 MED ORDER — RIVAROXABAN 20 MG OR TABS
ORAL_TABLET | ORAL | 12 refills | Status: DC
Start: 2016-07-31 — End: 2019-08-09

## 2016-07-31 MED ORDER — LISINOPRIL 20 MG OR TABS
20.0000 mg | ORAL_TABLET | Freq: Every day | ORAL | 12 refills | Status: DC
Start: 2016-07-31 — End: 2016-07-31

## 2016-07-31 NOTE — Progress Notes (Signed)
OUTPATIENT VISIT    ID/CHIEF COMPLAINT: Mark Poole is a 66 year old male here for Heart Failure and Atrial Fibrillation      HISTORY:  66 year old male with chronic permanent atrial fibrillation and mild aortic stenosis returns for routine follow-up.  He feels well and denies chest pain shortness breath dictations or edema.  Adherent with medications.  Now retired fully.  Had a flulike illness this spring that was quite severe but no hospital visits or doctor visits.  He is now feeling better and walking daily for exercise.  No bleeding on anticoagulant.    REVIEW OF SYSTEMS:  A complete ROS was performed and is negative, except where noted in the above HPI.    PHYSICAL EXAM:  VITALS - Blood pressure 109/55, pulse 56, temperature 97.9 F (36.6 C), temperature source Temporal, height 5\' 10"  (1.778 m), weight 181 lb (82.1 kg), SpO2 100 %.  GENERAL - well appearing no acute distress  HEENT - anicteric mucous membranes moist  RESPIRATORY - clear to auscultation bilaterally  CARDIOVASCULAR - regular rate and rhythm normal rate, no jugular venous distention, no lower extremity edema, normal S1-S2 with a 3/6 crescendo decrescendo systolic murmur heard best at the right upper sternal border  ABDOMEN - nontender  SKIN - warm dry pink  NEURO - face symmetric  MENTAL STATUS - alert and oriented    DIAGNOSTIC STUDIES:  INRs have been in the therapeutic range or slightly super therapeutic recently    Creatinine is increased at 2.0 up from 1.2-1.5.    ECG shows normal sinus rhythm normal axis normal intervals and normal QTC.    ASSESSMENT/PLAN:  Man was seen today for heart failure with recovered ejection fraction and chronic atrial fibrillation and aortic stenosis.  He denies cardiac symptoms and has a cardiac exam notable for his known aortic stenosis and atrial fibrillation.  He has been therapeutically anticoagulated.  He is adherent with his medications including amiodarone.    Renal function has worsened and  will recheck this in one week, and infirmities elevated will send urinalysis, obtain renal ultrasound and consider referral to nephrology.  Differential diagnosis includes prerenal, intrarenal.  Notably he is on lisinopril and spironolactone though has had stable creatinine on these drugs in the past.    LFTs are normal and will rerefer to pulmonary function testing to follow up is on amiodarone.    He reported some lightheadedness on higher doses of lisinopril and has been taking 20 mg daily so we have refilled at this dose and will continue given his stock blood pressure of 481 and diastolic blood pressure of 55 today.  He should continue this in his metoprolol for guideline directed medical therapy given his history of heart failure with recovered ejection fraction.    He should continue amiodarone for rhythm control strategy of his chronic atrial fibrillation.    We will obtain an echocardiogram to follow-up on his known mild aortic stenosis.    He is seeing endocrinology next week and they'll follow him up for his history of hypothyroidism.    I refilled all his prescriptions today.  I encouraged him to establish with a primary care provider.    Diagnoses and all orders for this visit:    Paroxysmal atrial fibrillation (HCC)  -     BASIC METABOLIC PANEL  -     Rivaroxaban (XARELTO) 20 MG Oral Tab; TAKE 1 TABLET BY MOUTH DAILY.  -     Amiodarone HCl 200 MG Oral  Tab; TAKE 1 TABLET(200 MG) BY MOUTH DAILY  -     REFERRAL TO PFT DIAGNOSTIC LAB  -     EKG 12-LEAD    Nonrheumatic aortic valve stenosis  -     BASIC METABOLIC PANEL  -     REFERRAL TO ECHOCARDIOGRAM    Essential hypertension  -     Lisinopril 20 MG Oral Tab; Take 1 tablet (20 mg) by mouth daily.  -     BASIC METABOLIC PANEL    Systolic heart failure, unspecified heart failure chronicity (HCC)  -     BASIC METABOLIC PANEL  -     Metoprolol Succinate ER 50 MG Oral TABLET SR 24 HR; Take 1 tablet (50 mg) by mouth daily. Do not chew or crush.  -      Spironolactone 25 MG Oral Tab; TAKE 1 TABLET(25 MG) BY MOUTH DAILY    Chronic systolic heart failure (HCC)  -     Amiodarone HCl 200 MG Oral Tab; TAKE 1 TABLET(200 MG) BY MOUTH DAILY    Hypothyroidism due to medication  -     Levothyroxine Sodium 100 MCG Oral Tab; TAKE 1 TABLET(100 MCG) BY MOUTH DAILY ON AN EMPTY STOMACH          Return in about 1 year (around 07/31/2017).        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 Moderate global hypokinesis. Right ventricle is normal in size with moderate systolic dysfunction. Mild-moderate tricuspid regurgitation: estimated pulmonary artery systolic pressures are normal (29-34 mmHg).  Moderate mitral regurgitation.Trileaflet aortic valve with mild valvular stenosis and trace regurgitation.No pericardial effusion        Left atrial thrombus 08/30/2012     08/30/12. TEE. Small thrombus in the left atrial appendage. A bright immobile structure in the appendage is again visualized, possibly representing a pectinate muscle vs calcified thrombus. Some views show an aspect of this mass that appears consistent with mobile thrombus. There is spontaneous echo contrast in the appendage and left atrium. Cardioversion was thus deferred.           Atrial fibrillation ([place]) 08/19/2012        SOCIAL HISTORY:  Social History     Social History    Marital status: Single     Spouse name: N/A    Number of children: N/A    Years of education:  N/A     Occupational History    Not on file.     Social History Main Topics    Smoking status: Former Smoker     Packs/day: 1.00     Years: 20.00     Types: Cigarettes    Smokeless tobacco: Not on file    Alcohol use No    Drug use: No    Sexual activity: Not on file     Other Topics Concern    Not on file     Social History Narrative  No narrative on file       CURRENT MEDICATIONS:  as of end of visit on 07/31/2016    Current Outpatient Prescriptions   Medication Sig Dispense Refill    Amiodarone HCl 200 MG Oral Tab TAKE 1 TABLET(200 MG) BY MOUTH DAILY 30 tablet 12    Atorvastatin Calcium 80 MG Oral Tab Take 1 tablet (80 mg) by mouth daily. 90 tablet 12    Levothyroxine Sodium 100 MCG Oral Tab TAKE 1 TABLET(100 MCG) BY MOUTH DAILY ON AN EMPTY STOMACH 90 tablet 12    Lisinopril 20 MG Oral Tab Take 1 tablet (20 mg) by mouth daily. 60 tablet 12    Metoprolol Succinate ER 50 MG Oral TABLET SR 24 HR Take 1 tablet (50 mg) by mouth daily. Do not chew or crush. 90 tablet 12    Rivaroxaban (XARELTO) 20 MG Oral Tab TAKE 1 TABLET BY MOUTH DAILY. 30 tablet 12    Spironolactone 25 MG Oral Tab TAKE 1 TABLET(25 MG) BY MOUTH DAILY 30 tablet 12     No current facility-administered medications for this visit.        ALLERGIES:  Review of patient's allergies indicates:  No Known Allergies     VITAL SIGN TRENDS:       Wt Readings from Last 5 Encounters:   07/31/16 181 lb (82.1 kg)   06/21/15 176 lb 9.6 oz (80.1 kg)   05/02/15 179 lb 14.3 oz (81.6 kg)   08/17/14 172 lb 9.6 oz (78.3 kg)   07/12/14 172 lb (78 kg)          BP Readings from Last 5 Encounters:   07/31/16 109/55   06/21/15 142/76   05/02/15 134/65   08/17/14 150/74   07/12/14 123/77        STUDIES:  Orders Only on 07/07/2016   Component Date Value Ref Range Status    Thyroid Stimulating Hormone 07/31/2016 1.238  0.400 - 5.000 u[IU]/mL Final    Thyroxine (Free) 07/31/2016 1.2  0.6 - 1.2 ng/dL Final    Triiodothyronine (T3) 07/31/2016 73  73 - 178 ng/dL  Final

## 2016-07-31 NOTE — Patient Instructions (Addendum)
We will check an echocardiogram to re-evaluate your aortic stenosis.    We will recheck your kidney function to evaluate the change in your kidney function seen on todays lab tests.    We will refer you for a lung function test.    We refilled all your medications today. Please continue them as prescribed. We are decreasing the lisinopril to 20 mg once daily (as you are taking it now).    Please continue the amiodarone.    Please establish care with a primary care doctor.

## 2016-08-01 LAB — EKG 12 LEAD
Atrial Rate: 50 {beats}/min
P Axis: 26 degrees
P-R Interval: 198 ms
Q-T Interval: 474 ms
QRS Duration: 94 ms
QTC Calculation: 432 ms
R Axis: 5 degrees
T Axis: 41 degrees
Ventricular Rate: 50 {beats}/min

## 2016-08-01 MED ORDER — LISINOPRIL 20 MG OR TABS
ORAL_TABLET | ORAL | 3 refills | Status: DC
Start: 2016-08-01 — End: 2016-10-30

## 2016-08-16 ENCOUNTER — Other Ambulatory Visit (HOSPITAL_BASED_OUTPATIENT_CLINIC_OR_DEPARTMENT_OTHER): Payer: Self-pay | Admitting: Internal Medicine

## 2016-08-16 DIAGNOSIS — I5020 Unspecified systolic (congestive) heart failure: Secondary | ICD-10-CM

## 2016-08-16 DIAGNOSIS — I480 Paroxysmal atrial fibrillation: Secondary | ICD-10-CM

## 2016-08-25 ENCOUNTER — Other Ambulatory Visit (HOSPITAL_BASED_OUTPATIENT_CLINIC_OR_DEPARTMENT_OTHER): Payer: Self-pay | Admitting: Internal Medicine

## 2016-08-25 DIAGNOSIS — I480 Paroxysmal atrial fibrillation: Secondary | ICD-10-CM

## 2016-08-25 DIAGNOSIS — I5022 Chronic systolic (congestive) heart failure: Secondary | ICD-10-CM

## 2016-08-26 ENCOUNTER — Other Ambulatory Visit (HOSPITAL_BASED_OUTPATIENT_CLINIC_OR_DEPARTMENT_OTHER): Payer: Self-pay | Admitting: Internal Medicine

## 2016-08-26 DIAGNOSIS — I480 Paroxysmal atrial fibrillation: Secondary | ICD-10-CM

## 2016-08-26 DIAGNOSIS — I5022 Chronic systolic (congestive) heart failure: Secondary | ICD-10-CM

## 2016-08-26 MED ORDER — AMIODARONE HCL 200 MG OR TABS
ORAL_TABLET | ORAL | 11 refills | Status: DC
Start: 2016-08-26 — End: 2016-08-26

## 2016-08-27 MED ORDER — AMIODARONE HCL 200 MG OR TABS
ORAL_TABLET | ORAL | 3 refills | Status: DC
Start: 2016-08-27 — End: 2019-08-09

## 2016-09-09 ENCOUNTER — Ambulatory Visit (HOSPITAL_BASED_OUTPATIENT_CLINIC_OR_DEPARTMENT_OTHER): Payer: Medicare HMO | Attending: Internal Medicine

## 2016-09-09 DIAGNOSIS — I4891 Unspecified atrial fibrillation: Secondary | ICD-10-CM | POA: Insufficient documentation

## 2016-10-16 ENCOUNTER — Other Ambulatory Visit (HOSPITAL_COMMUNITY): Payer: Self-pay

## 2016-10-16 ENCOUNTER — Ambulatory Visit (HOSPITAL_BASED_OUTPATIENT_CLINIC_OR_DEPARTMENT_OTHER): Payer: Medicare HMO | Attending: Cardiovascular Disease

## 2016-10-16 DIAGNOSIS — I350 Nonrheumatic aortic (valve) stenosis: Secondary | ICD-10-CM | POA: Insufficient documentation

## 2016-10-17 ENCOUNTER — Other Ambulatory Visit (HOSPITAL_BASED_OUTPATIENT_CLINIC_OR_DEPARTMENT_OTHER): Payer: Self-pay | Admitting: Internal Medicine

## 2016-10-17 DIAGNOSIS — I5020 Unspecified systolic (congestive) heart failure: Secondary | ICD-10-CM

## 2016-10-17 MED ORDER — METOPROLOL SUCCINATE ER 50 MG OR TB24
EXTENDED_RELEASE_TABLET | ORAL | 2 refills | Status: AC
Start: 2016-10-17 — End: ?

## 2016-10-21 ENCOUNTER — Other Ambulatory Visit: Payer: Self-pay | Admitting: Endocrinology

## 2016-10-21 DIAGNOSIS — E032 Hypothyroidism due to medicaments and other exogenous substances: Secondary | ICD-10-CM

## 2016-10-23 ENCOUNTER — Encounter (HOSPITAL_BASED_OUTPATIENT_CLINIC_OR_DEPARTMENT_OTHER): Payer: Self-pay | Admitting: Internal Medicine

## 2016-10-30 ENCOUNTER — Telehealth (HOSPITAL_BASED_OUTPATIENT_CLINIC_OR_DEPARTMENT_OTHER): Payer: Self-pay

## 2016-10-30 ENCOUNTER — Ambulatory Visit (HOSPITAL_BASED_OUTPATIENT_CLINIC_OR_DEPARTMENT_OTHER): Payer: Medicare HMO | Admitting: Internal Medicine

## 2016-10-30 ENCOUNTER — Other Ambulatory Visit (HOSPITAL_BASED_OUTPATIENT_CLINIC_OR_DEPARTMENT_OTHER): Payer: Self-pay | Admitting: Internal Medicine

## 2016-10-30 ENCOUNTER — Encounter (HOSPITAL_BASED_OUTPATIENT_CLINIC_OR_DEPARTMENT_OTHER): Payer: Self-pay | Admitting: Internal Medicine

## 2016-10-30 ENCOUNTER — Inpatient Hospital Stay (HOSPITAL_COMMUNITY): Payer: Medicare HMO | Admitting: Internal Medicine

## 2016-10-30 ENCOUNTER — Inpatient Hospital Stay (HOSPITAL_BASED_OUTPATIENT_CLINIC_OR_DEPARTMENT_OTHER)
Admission: AD | Admit: 2016-10-30 | Discharge: 2016-10-31 | DRG: 812 | Disposition: A | Discharge status: Home / Self Care | Payer: Medicare HMO | Attending: Internal Medicine | Admitting: Internal Medicine

## 2016-10-30 VITALS — BP 121/68 | HR 50 | Temp 97.9°F | Ht 70.0 in | Wt 178.2 lb

## 2016-10-30 DIAGNOSIS — I350 Nonrheumatic aortic (valve) stenosis: Secondary | ICD-10-CM

## 2016-10-30 DIAGNOSIS — R0600 Dyspnea, unspecified: Secondary | ICD-10-CM

## 2016-10-30 DIAGNOSIS — I4891 Unspecified atrial fibrillation: Secondary | ICD-10-CM | POA: Diagnosis present

## 2016-10-30 DIAGNOSIS — I5020 Unspecified systolic (congestive) heart failure: Secondary | ICD-10-CM

## 2016-10-30 DIAGNOSIS — Z923 Personal history of irradiation: Secondary | ICD-10-CM

## 2016-10-30 DIAGNOSIS — C8590 Non-Hodgkin lymphoma, unspecified, unspecified site: Secondary | ICD-10-CM

## 2016-10-30 DIAGNOSIS — Z7901 Long term (current) use of anticoagulants: Secondary | ICD-10-CM

## 2016-10-30 DIAGNOSIS — N189 Chronic kidney disease, unspecified: Secondary | ICD-10-CM | POA: Diagnosis present

## 2016-10-30 DIAGNOSIS — D509 Iron deficiency anemia, unspecified: Principal | ICD-10-CM | POA: Diagnosis present

## 2016-10-30 DIAGNOSIS — R5383 Other fatigue: Secondary | ICD-10-CM

## 2016-10-30 DIAGNOSIS — Z9221 Personal history of antineoplastic chemotherapy: Secondary | ICD-10-CM

## 2016-10-30 DIAGNOSIS — I5022 Chronic systolic (congestive) heart failure: Secondary | ICD-10-CM | POA: Diagnosis present

## 2016-10-30 DIAGNOSIS — I509 Heart failure, unspecified: Secondary | ICD-10-CM

## 2016-10-30 DIAGNOSIS — E875 Hyperkalemia: Secondary | ICD-10-CM | POA: Diagnosis present

## 2016-10-30 DIAGNOSIS — Z87891 Personal history of nicotine dependence: Secondary | ICD-10-CM

## 2016-10-30 DIAGNOSIS — D649 Anemia, unspecified: Secondary | ICD-10-CM

## 2016-10-30 DIAGNOSIS — I480 Paroxysmal atrial fibrillation: Secondary | ICD-10-CM

## 2016-10-30 DIAGNOSIS — D689 Coagulation defect, unspecified: Secondary | ICD-10-CM

## 2016-10-30 DIAGNOSIS — I130 Hypertensive heart and chronic kidney disease with heart failure and stage 1 through stage 4 chronic kidney disease, or unspecified chronic kidney disease: Secondary | ICD-10-CM | POA: Diagnosis present

## 2016-10-30 DIAGNOSIS — Z6825 Body mass index (BMI) 25.0-25.9, adult: Secondary | ICD-10-CM

## 2016-10-30 DIAGNOSIS — R791 Abnormal coagulation profile: Secondary | ICD-10-CM | POA: Diagnosis present

## 2016-10-30 DIAGNOSIS — E039 Hypothyroidism, unspecified: Secondary | ICD-10-CM | POA: Diagnosis present

## 2016-10-30 LAB — LACTATE DEHYDROGENASE: Lactate Dehydrogenase: 128 U/L (ref <210)

## 2016-10-30 LAB — PROTHROMBIN & PTT
Partial Thromboplastin Time: 56 s — ABNORMAL HIGH (ref 22–35)
Partial Thromboplastin Time: 57 s — ABNORMAL HIGH (ref 22–35)
Partial Thromboplastin Time: 49 s — ABNORMAL HIGH (ref 22–35)
Prothrombin INR: 7 (ref 0.8–1.3)
Prothrombin INR: 6.7 (ref 0.8–1.3)
Prothrombin INR: 5.2 (ref 0.8–1.3)
Prothrombin Time Patient: 61 s — ABNORMAL HIGH (ref 10.7–15.6)
Prothrombin Time Patient: 59.4 s — ABNORMAL HIGH (ref 10.7–15.6)
Prothrombin Time Patient: 48.6 s — ABNORMAL HIGH (ref 10.7–15.6)

## 2016-10-30 LAB — HEPATIC FUNCTION PANEL
ALT (GPT): 14 U/L (ref 10–48)
AST (GOT): 12 U/L (ref 9–38)
Albumin: 4.2 g/dL (ref 3.5–5.2)
Alkaline Phosphatase (Total): 52 U/L (ref 36–161)
Bilirubin (Direct): 0.1 mg/dL (ref 0.0–0.3)
Bilirubin (Total): 0.5 mg/dL (ref 0.2–1.3)
Protein (Total): 7.5 g/dL (ref 6.0–8.2)

## 2016-10-30 LAB — BASIC METABOLIC PANEL
Anion Gap: 9 (ref 4–12)
Anion Gap: 8 (ref 4–12)
Calcium: 9.4 mg/dL (ref 8.9–10.2)
Calcium: 9.5 mg/dL (ref 8.9–10.2)
Carbon Dioxide, Total: 23 meq/L (ref 22–32)
Carbon Dioxide, Total: 24 meq/L (ref 22–32)
Chloride: 105 meq/L (ref 98–108)
Chloride: 104 meq/L (ref 98–108)
Creatinine: 1.95 mg/dL — ABNORMAL HIGH (ref 0.51–1.18)
Creatinine: 1.92 mg/dL — ABNORMAL HIGH (ref 0.51–1.18)
GFR, Calc, African American: 42 mL/min/{1.73_m2} — ABNORMAL LOW (ref >59)
GFR, Calc, African American: 43 mL/min/{1.73_m2} — ABNORMAL LOW (ref >59)
GFR, Calc, European American: 34 mL/min/{1.73_m2} — ABNORMAL LOW (ref >59)
GFR, Calc, European American: 35 mL/min/{1.73_m2} — ABNORMAL LOW (ref >59)
Glucose: 106 mg/dL (ref 62–125)
Glucose: 81 mg/dL (ref 62–125)
Potassium: 5.5 meq/L — ABNORMAL HIGH (ref 3.6–5.2)
Potassium: 4.7 meq/L (ref 3.6–5.2)
Sodium: 137 meq/L (ref 135–145)
Sodium: 136 meq/L (ref 135–145)
Urea Nitrogen: 26 mg/dL — ABNORMAL HIGH (ref 8–21)
Urea Nitrogen: 25 mg/dL — ABNORMAL HIGH (ref 8–21)

## 2016-10-30 LAB — CBC (HEMOGRAM)
Hematocrit: 24 % — ABNORMAL LOW (ref 38–50)
Hematocrit: 23 % — ABNORMAL LOW (ref 38–50)
Hemoglobin: 6.4 g/dL — ABNORMAL LOW (ref 13.0–18.0)
Hemoglobin: 6.2 g/dL — ABNORMAL LOW (ref 13.0–18.0)
MCH: 20.7 pg — ABNORMAL LOW (ref 27.3–33.6)
MCH: 20.6 pg — ABNORMAL LOW (ref 27.3–33.6)
MCHC: 27.2 g/dL — ABNORMAL LOW (ref 32.2–36.5)
MCHC: 27.6 g/dL — ABNORMAL LOW (ref 32.2–36.5)
MCV: 76 fL — ABNORMAL LOW (ref 81–98)
MCV: 75 fL — ABNORMAL LOW (ref 81–98)
Platelet Count: 325 10*3/uL (ref 150–400)
Platelet Count: 306 10*3/uL (ref 150–400)
RBC: 3.09 10*6/uL — ABNORMAL LOW (ref 4.40–5.60)
RBC: 3.01 10*6/uL — ABNORMAL LOW (ref 4.40–5.60)
RDW-CV: 16.5 % — ABNORMAL HIGH (ref 11.6–14.4)
RDW-CV: 16.5 % — ABNORMAL HIGH (ref 11.6–14.4)
WBC: 6.97 10*3/uL (ref 4.30–10.00)
WBC: 6.3 10*3/uL (ref 4.30–10.00)

## 2016-10-30 LAB — IRON BINDING CAPACITY (W/IRON, TRANSFERRIN & TRANSF SAT)
Iron, SRM: 13 ug/dL — ABNORMAL LOW (ref 31–171)
Total Iron Binding Capacity: 571 ug/dL — ABNORMAL HIGH (ref 250–460)
Transferrin Saturation: 2 % — ABNORMAL LOW (ref 15–50)
Transferrin: 408 mg/dL — ABNORMAL HIGH (ref 180–329)

## 2016-10-30 LAB — LAB ADD ON ORDER

## 2016-10-30 MED ORDER — PHYTONADIONE 5 MG OR TABS
5.0000 mg | ORAL_TABLET | Freq: Once | ORAL | 0 refills | Status: AC
Start: 2016-10-30 — End: 2016-10-30

## 2016-10-30 NOTE — Telephone Encounter (Signed)
Critical lab , INR 7.0    Patient had left clinic already, called Dr. Jabier Mutton who asked patient to:  1. Return to lab and have a redraw, with lfts.  2. Stop by [address] and get Vit k.  3. Hold his xeralot until instructed        Patient was called and he was told the above information.

## 2016-10-30 NOTE — Progress Notes (Signed)
Patient has INR = 7, not on warfarin (but is on DOAC). Rechecking and ordering Vit K 5mg  PO x 1 for today.

## 2016-10-30 NOTE — Progress Notes (Signed)
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  He describes several months of profound fatigue, orthostatic lightheadedness without syncope, but denies chest pain palpitations or edema.  He is adherent with his medications.  I previously contacted him regarding the progression of his aortic stenosis and he returns aware that this will require intervention.      REVIEW OF SYSTEMS:  A complete ROS was performed and is negative, except where noted in the above HPI.    PHYSICAL EXAM:  VITALS - Blood pressure 121/68, pulse 50, temperature 97.9 F (36.6 C), temperature source Temporal, height 5\' 10"  (1.778 m), weight 178 lb 3.2 oz (80.8 kg), SpO2 100 %.  GENERAL - well-appearing in no acute distress, pale  HEENT - anicteric  RESPIRATORY - clear to auscultation bilaterally  CARDIOVASCULAR - regular rate and rhythm with loud precordial systolic murmur and no lower extremity edema or jugular venous distention  ABDOMEN - normal bowel tones  SKIN - warm dry pale and pink  NEURO - face symmetric  MENTAL STATUS - alert and oriented    DIAGNOSTIC STUDIES:  October 16, 2016 echocardiogram  LV is normal in size and systolic function. EF is 58% by 2D biplane method of disks. No focal wall motion abnormalities. LV diastolic dysfunction is present, with normal LV filling pressures.    Aortic valve is severely sclerosed with moderate to severe stenosis. Peak velocity of 4.1 m/s is consistent with stevere stenosis, while a mean PG of 32 mmHg and valve area of 1.2 cm2 are consistent with moderate stenosis. Trace regurgitation.    RV size and systolic  function are normal. PASP is mildly elevated at 32-37 mmHg.    Trace TR/MR.    Compared to TTE from 04/01/13, aortic stenosis is significantly worse.   The patient's rhythm is normal sinus rhythm. Todays study was compared to prior echo study performed on 04/01/13.        ASSESSMENT/PLAN:  Mark Poole was seen today for atrial fibrillation, heart failure with recovered ejection fraction and severe aortic stenosis.  He has symptoms of fatigue and orthostasis.  His cardiac exam is consistent with severe aortic stenosis.  He has multiple lab abnormalities today that are new for him including severe quite lop of the, hyperkalemia and new anemia with hematocrit of 24.  Initially I had prescribed him outpatient vitamin K 5 mg after seeing the INR of 7 and asked for him to come back for repeat lab draws.  Upon seeing the remaining lab abnormalities advised him to come into the emergency department for admission for workup of possible underlying causes, which included GI bleeding, other occult bleeds, and kidney injury.  He has stopped by to get that second INR drawn, has picked up the vitamin K, but needs to return home to drop off his dog and then will return to the ER.  I will contact the ER to advise them of this plan.    Aortic stenosis: I've reviewed his recent echocardiogram images in detail and they are consistent with severe aortic stenosis.  His symptoms are also classic for severe aortic stenosis.  I discussed with him the risks and benefits  of valve replacement and also the possibility of doing this surgically or percutaneously.  I have referred him as soon as possible to see both interventional cardiology and cardiac surgery at the [place] of [place] for a discussion of the most appropriate approach to valve replacement.      Atrial fibrillation: Patient is well rate controlled on his current medical regimen and anticoagulated on DOAC.  He continues on amiodarone for rhythm control strategy.  He has had left  atrial thrombus in the past.    Heart failure with recovered ejection fraction: Patient is doing well on appropriate guidelines recommend medical therapy for heart failure with recovered ejection fraction.  Due to his chronic kidney disease we do not have him on a renin-angiotensin inhibitor.    Hypertension: Well-controlled and responsive in particular to spironolactone as well as metoprolol    Coagulopathy: Following leaving clinic today his routine screening labs showed an INR of 7 even though he is not on warfarin.  We contacted him and asked him to come immediately back to the hospital this evening to have repeat labs drawn, to confirm the INR but also to check LFTs.  I've also prescribed a single one-time oral dose of vitamin K 5 mg by mouth for him to take this evening.  I will follow-up with the results of his labs.    Hyperkalemia: Potassium of 5.5 on labs today despite stable creatinine of 1.9.    Anemia: Hematocrit of 24 today.  Last checked in 2015 was closer to 37.        Diagnoses and all orders for this visit:    Nonrheumatic aortic valve stenosis  -     REFERRAL TO CARDIOLOGY  -     REFERRAL TO CARDIOTHORACIC SURG  -     EKG 12-LEAD  -     CBC (HEMOGRAM)  -     BASIC METABOLIC PANEL  -     PROTHROMBIN & PTT    Systolic heart failure, unspecified heart failure chronicity (HCC)          Return in about 4 weeks (around 11/27/2016), or and RN visit in 1 week for BP and symptom check.        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 Moderate global hypokinesis. Right ventricle is normal in size with moderate systolic dysfunction. Mild-moderate tricuspid regurgitation: estimated pulmonary artery systolic pressures are normal (29-34 mmHg).  Moderate mitral regurgitation.Trileaflet aortic valve with mild valvular stenosis and trace regurgitation.No pericardial effusion        Left atrial thrombus 08/30/2012     08/30/12. TEE. Small thrombus in the left atrial appendage. A bright immobile structure in the appendage is again visualized, possibly representing a pectinate muscle vs calcified thrombus. Some views show an aspect of this mass that appears consistent with mobile thrombus. There is spontaneous echo contrast in the appendage and left atrium. Cardioversion was  thus deferred.           Atrial fibrillation ([place]) 08/19/2012        SOCIAL HISTORY:  Social History     Social History    Marital status: Single     Spouse name: N/A    Number of children: N/A    Years of education: N/A     Occupational History    Not on file.     Social History Main Topics    Smoking status: Former Smoker     Packs/day: 1.00     Years: 20.00     Types: Cigarettes    Smokeless tobacco: Not on file    Alcohol use No    Drug use: No    Sexual activity: Not on file     Other Topics Concern    Not on file     Social History Narrative    No narrative on file       CURRENT MEDICATIONS:  as of end of visit on 10/30/2016    Current Outpatient Prescriptions   Medication Sig Dispense Refill    Amiodarone HCl 200 MG Oral Tab TAKE 1 TABLET BY MOUTH EVERY DAY 90 tablet 3    Atorvastatin Calcium 80 MG Oral Tab Take 1 tablet (80 mg) by mouth daily. 90 tablet 12    Levothyroxine Sodium 100 MCG Oral Tab TAKE 1 TABLET(100 MCG) BY MOUTH DAILY ON AN EMPTY STOMACH 90 tablet 12    Metoprolol Succinate ER 50 MG Oral TABLET SR 24 HR TAKE 1 TABLET(50 MG) BY MOUTH  DAILY. DO NOT CHEW OR CRUSH 90 tablet 2    Phytonadione 5 MG Oral Tab Take 1 tablet (5 mg) by mouth One time for 1 dose. Take dose today. 1 tablet 0    Rivaroxaban (XARELTO) 20 MG Oral Tab TAKE 1 TABLET BY MOUTH DAILY. 30 tablet 12    Spironolactone 25 MG Oral Tab TAKE 1 TABLET(25 MG) BY MOUTH DAILY 30 tablet 12     No current facility-administered medications for this visit.        ALLERGIES:  Review of patient's allergies indicates:  No Known Allergies     VITAL SIGN TRENDS:       Wt Readings from Last 5 Encounters:   10/30/16 178 lb 3.2 oz (80.8 kg)   07/31/16 181 lb (82.1 kg)   06/21/15 176 lb 9.6 oz (80.1 kg)   05/02/15 179 lb 14.3 oz (81.6 kg)   08/17/14 172 lb 9.6 oz (78.3 kg)          BP Readings from Last 5 Encounters:   10/30/16 121/68   07/31/16 109/55   06/21/15 142/76   05/02/15 134/65   08/17/14 150/74        STUDIES:  Orders Only on 07/31/2016   Component Date Value Ref Range Status    Sodium 07/31/2016 140  135 - 145 meq/L Final    Potassium 07/31/2016 5.1  3.6 - 5.2 meq/L Final    Chloride 07/31/2016 108  98 - 108 meq/L Final    Carbon Dioxide, Total 07/31/2016 24  22 - 32 meq/L Final    Anion Gap 07/31/2016 8  4 - 12 Final    Glucose 07/31/2016 95  62 - 125 mg/dL Final    Urea Nitrogen 07/31/2016 28* 8 - 21 mg/dL Final    Creatinine 07/31/2016 2.03* 0.51 - 1.18 mg/dL Final    Protein (Total) 07/31/2016 7.3  6.0 - 8.2 g/dL Final  Albumin 07/31/2016 4.0  3.5 - 5.2 g/dL Final    Bilirubin (Total) 07/31/2016 0.3  0.2 - 1.3 mg/dL Final    Calcium 07/31/2016 9.1  8.9 - 10.2 mg/dL Final    AST (GOT) 07/31/2016 14  9 - 38 U/L Final    Alkaline Phosphatase (Total) 07/31/2016 49  36 - 161 U/L Final    ALT (GPT) 07/31/2016 16  10 - 48 U/L Final    GFR, Calc, European American 07/31/2016 33* >59 mL/min/[1.73_m2] Final    GFR, Calc, African American 07/31/2016 40* >59 mL/min/[1.73_m2] Final    GFR, Information 07/31/2016 Calculated GFR in mL/min/1.73 m2 by MDRD equation.  Inaccurate with  changing renal function.  See [email or web address]://[email or web address]   Final

## 2016-10-30 NOTE — Patient Instructions (Addendum)
You have severe aortic stenosis. I recommend evaluation right away for valve replacement. I have referred you to [facility] for further evaluation.    Stop your lisinopril today.    Return in 1 week for a nurse visit to check blood pressure and your symptoms.

## 2016-10-31 ENCOUNTER — Other Ambulatory Visit (HOSPITAL_BASED_OUTPATIENT_CLINIC_OR_DEPARTMENT_OTHER): Payer: Self-pay | Admitting: Internal Medicine

## 2016-10-31 ENCOUNTER — Other Ambulatory Visit: Payer: Self-pay | Admitting: Internal Medicine

## 2016-10-31 DIAGNOSIS — R0602 Shortness of breath: Secondary | ICD-10-CM

## 2016-10-31 DIAGNOSIS — I4891 Unspecified atrial fibrillation: Secondary | ICD-10-CM

## 2016-10-31 DIAGNOSIS — D509 Iron deficiency anemia, unspecified: Secondary | ICD-10-CM

## 2016-10-31 DIAGNOSIS — N2889 Other specified disorders of kidney and ureter: Secondary | ICD-10-CM

## 2016-10-31 DIAGNOSIS — N189 Chronic kidney disease, unspecified: Secondary | ICD-10-CM

## 2016-10-31 DIAGNOSIS — R791 Abnormal coagulation profile: Secondary | ICD-10-CM

## 2016-10-31 DIAGNOSIS — R5383 Other fatigue: Secondary | ICD-10-CM

## 2016-10-31 LAB — TYPE AND SCREEN
ABO/Rh: A POS
Antibody Screen: NEGATIVE
Units Ordered: 2

## 2016-10-31 LAB — BASIC METABOLIC PANEL
Anion Gap: 5 (ref 4–12)
Calcium: 9.2 mg/dL (ref 8.9–10.2)
Carbon Dioxide, Total: 27 meq/L (ref 22–32)
Chloride: 106 meq/L (ref 98–108)
Creatinine: 1.8 mg/dL — ABNORMAL HIGH (ref 0.51–1.18)
GFR, Calc, African American: 46 mL/min/{1.73_m2} — ABNORMAL LOW (ref >59)
GFR, Calc, European American: 38 mL/min/{1.73_m2} — ABNORMAL LOW (ref >59)
Glucose: 82 mg/dL (ref 62–125)
Potassium: 4.6 meq/L (ref 3.6–5.2)
Sodium: 138 meq/L (ref 135–145)
Urea Nitrogen: 24 mg/dL — ABNORMAL HIGH (ref 8–21)

## 2016-10-31 LAB — URINALYSIS WITH REFLEX CULTURE
Bacteria, URN: NONE SEEN
Bilirubin (Qual), URN: NEGATIVE
Epith Cells_Renal/Trans,URN: NEGATIVE /HPF
Epith Cells_Squamous, URN: NEGATIVE /LPF
Glucose Qual, URN: NEGATIVE mg/dL
Ketones, URN: NEGATIVE mg/dL
Leukocyte Esterase, URN: NEGATIVE
Nitrite, URN: NEGATIVE
Occult Blood, URN: NEGATIVE
Protein (Alb Semiquant), URN: NEGATIVE mg/dL
RBC, URN: NEGATIVE /HPF
Specific Gravity, URN: 1.005 g/mL (ref 1.002–1.027)
WBC, URN: NEGATIVE /HPF
pH, URN: 6 (ref 5.0–8.0)

## 2016-10-31 LAB — CBC (HEMOGRAM)
Hematocrit: 22 % — ABNORMAL LOW (ref 38–50)
Hemoglobin: 6.4 g/dL — ABNORMAL LOW (ref 13.0–18.0)
MCH: 21.5 pg — ABNORMAL LOW (ref 27.3–33.6)
MCHC: 28.6 g/dL — ABNORMAL LOW (ref 32.2–36.5)
MCV: 75 fL — ABNORMAL LOW (ref 81–98)
Platelet Count: 268 10*3/uL (ref 150–400)
RBC: 2.98 10*6/uL — ABNORMAL LOW (ref 4.40–5.60)
RDW-CV: 16.7 % — ABNORMAL HIGH (ref 11.6–14.4)
WBC: 4.56 10*3/uL (ref 4.30–10.00)

## 2016-10-31 LAB — PROTHROMBIN & PTT
Partial Thromboplastin Time: 43 s — ABNORMAL HIGH (ref 22–35)
Prothrombin INR: 3.2 — ABNORMAL HIGH (ref 0.8–1.3)
Prothrombin Time Patient: 33.3 s — ABNORMAL HIGH (ref 10.7–15.6)

## 2016-10-31 LAB — LAB ADD ON ORDER

## 2016-10-31 LAB — HAPTOGLOBIN (QUANT): Haptoglobin (Quant): 203 mg/dL (ref 44–215)

## 2016-10-31 LAB — RETICULOCYTE COUNT
Absolute Reticulocyte Count: 31 10*9/L (ref 25–125)
RETIC HEMOGLOBIN EQUIVALENT: 20.2 pg — ABNORMAL LOW (ref 28.0–38.0)
Reticulocyte Count, %: 1 % (ref 0.5–2.5)

## 2016-10-31 LAB — BLOOD TYPE CONFIRMATION: ABO/Rh: A POS

## 2016-10-31 LAB — HEMATOCRIT: Hematocrit: 28 % — ABNORMAL LOW (ref 38–50)

## 2016-10-31 LAB — SMEAR SCAN

## 2016-11-01 LAB — EKG 12 LEAD
Atrial Rate: 61 {beats}/min
Atrial Rate: 49 {beats}/min
Atrial Rate: 50 {beats}/min
P Axis: 28 degrees
P Axis: 12 degrees
P Axis: 16 degrees
P-R Interval: 192 ms
P-R Interval: 204 ms
P-R Interval: 208 ms
Q-T Interval: 450 ms
Q-T Interval: 486 ms
Q-T Interval: 490 ms
QRS Duration: 98 ms
QRS Duration: 92 ms
QRS Duration: 92 ms
QTC Calculation: 453 ms
QTC Calculation: 442 ms
QTC Calculation: 443 ms
R Axis: 18 degrees
R Axis: 0 degrees
R Axis: 3 degrees
T Axis: 61 degrees
T Axis: 43 degrees
T Axis: 43 degrees
Ventricular Rate: 61 {beats}/min
Ventricular Rate: 49 {beats}/min
Ventricular Rate: 50 {beats}/min

## 2016-11-03 ENCOUNTER — Telehealth (HOSPITAL_BASED_OUTPATIENT_CLINIC_OR_DEPARTMENT_OTHER): Payer: Self-pay | Admitting: Thoracic Surgery (Cardiothoracic Vascular Surgery)

## 2016-11-03 ENCOUNTER — Telehealth (HOSPITAL_BASED_OUTPATIENT_CLINIC_OR_DEPARTMENT_OTHER): Payer: Self-pay

## 2016-11-03 ENCOUNTER — Encounter (HOSPITAL_BASED_OUTPATIENT_CLINIC_OR_DEPARTMENT_OTHER): Payer: Self-pay | Admitting: Family Practice

## 2016-11-03 NOTE — Telephone Encounter (Addendum)
Patient understands that insurance is not contracted and that I am working to obtain referral and [place]. Pt agrees to cancel apt and r/s once apt is authorized.

## 2016-11-03 NOTE — Telephone Encounter (Signed)
Received request for Urgent EGD / Colon with anesthesia per GI Inpatient Team.     Reviewed pt information, called pt insurance [employer]  [phone]  Spoke to: [facility]  Call reference # [id number]    Who advised that [facility] is not in network with this type of [employer], and pt doesn't have Out of Network benefits.     Nelly Rout from Inpatient team that I spoke with Almyra Free from pt PCP office ([place]) and faxed Discharge Summary along with referral to them @ [phone]    Closing referral as "does not meet [place] Criteria     No further action at this time

## 2016-11-05 ENCOUNTER — Encounter (HOSPITAL_BASED_OUTPATIENT_CLINIC_OR_DEPARTMENT_OTHER): Payer: Medicare HMO | Admitting: Thoracic Surgery (Cardiothoracic Vascular Surgery)

## 2016-11-06 ENCOUNTER — Encounter (HOSPITAL_BASED_OUTPATIENT_CLINIC_OR_DEPARTMENT_OTHER): Payer: Medicare HMO

## 2016-11-06 ENCOUNTER — Encounter (HOSPITAL_BASED_OUTPATIENT_CLINIC_OR_DEPARTMENT_OTHER): Payer: Self-pay | Admitting: Internal Medicine

## 2016-11-06 ENCOUNTER — Other Ambulatory Visit (HOSPITAL_BASED_OUTPATIENT_CLINIC_OR_DEPARTMENT_OTHER): Payer: Self-pay

## 2016-11-06 ENCOUNTER — Other Ambulatory Visit (HOSPITAL_BASED_OUTPATIENT_CLINIC_OR_DEPARTMENT_OTHER): Payer: Self-pay | Admitting: Internal Medicine

## 2016-11-06 ENCOUNTER — Ambulatory Visit (HOSPITAL_BASED_OUTPATIENT_CLINIC_OR_DEPARTMENT_OTHER): Payer: Medicare HMO | Attending: Internal Medicine

## 2016-11-06 DIAGNOSIS — I350 Nonrheumatic aortic (valve) stenosis: Secondary | ICD-10-CM | POA: Insufficient documentation

## 2016-11-06 LAB — BASIC METABOLIC PANEL
Anion Gap: 7 (ref 4–12)
Calcium: 9.2 mg/dL (ref 8.9–10.2)
Carbon Dioxide, Total: 27 meq/L (ref 22–32)
Chloride: 103 meq/L (ref 98–108)
Creatinine: 1.76 mg/dL — ABNORMAL HIGH (ref 0.51–1.18)
GFR, Calc, African American: 47 mL/min/{1.73_m2} — ABNORMAL LOW (ref >59)
GFR, Calc, European American: 39 mL/min/{1.73_m2} — ABNORMAL LOW (ref >59)
Glucose: 102 mg/dL (ref 62–125)
Potassium: 4.6 meq/L (ref 3.6–5.2)
Sodium: 137 meq/L (ref 135–145)
Urea Nitrogen: 25 mg/dL — ABNORMAL HIGH (ref 8–21)

## 2016-11-06 LAB — PROTHROMBIN TIME
Prothrombin INR: 1.1 (ref 0.8–1.3)
Prothrombin Time Patient: 14.2 s (ref 10.7–15.6)

## 2016-11-06 LAB — CBC (HEMOGRAM)
Hematocrit: 30 % — ABNORMAL LOW (ref 38–50)
Hemoglobin: 8.3 g/dL — ABNORMAL LOW (ref 13.0–18.0)
MCH: 22.1 pg — ABNORMAL LOW (ref 27.3–33.6)
MCHC: 27.9 g/dL — ABNORMAL LOW (ref 32.2–36.5)
MCV: 79 fL — ABNORMAL LOW (ref 81–98)
Platelet Count: 271 10*3/uL (ref 150–400)
RBC: 3.75 10*6/uL — ABNORMAL LOW (ref 4.40–5.60)
RDW-CV: 18.8 % — ABNORMAL HIGH (ref 11.6–14.4)
WBC: 4.95 10*3/uL (ref 4.30–10.00)

## 2016-11-06 NOTE — Progress Notes (Signed)
Patient is here for a Nurse Visit on 11/06/2016 as ordered by Dr. Jabier Mutton after patient was admitted on 10/30/16 overnight for an INR of 7.0 and a Hct of 22.     Patient today feels better, less fatigued, breathing better and not dizzy. Patient denies chest pain, no syncope or near syncope. No BLE edema, no orthopnea.     Patient is NOT on anticoagulation. Taking Iron supplements.    Irregular rhythm, rate controlled. Murmur. Lungs CTA. No BLE edema    Vitals:  Vitals:    11/06/16 1426   BP: 128/79   BP Cuff Size: Regular   BP Site: Left Arm   BP Position: Sitting   Pulse: 70   Resp: 18   Weight: 179 lb 1.6 oz (81.2 kg)     Wt Readings from Last 5 Encounters:   11/06/16 179 lb 1.6 oz (81.2 kg)   10/30/16 178 lb 3.2 oz (80.8 kg)   07/31/16 181 lb (82.1 kg)   06/21/15 176 lb 9.6 oz (80.1 kg)   05/02/15 179 lb 14.3 oz (81.6 kg)       Medications:  Current Outpatient Prescriptions   Medication Sig Dispense Refill    Amiodarone HCl 200 MG Oral Tab TAKE 1 TABLET BY MOUTH EVERY DAY 90 tablet 3    Atorvastatin Calcium 80 MG Oral Tab Take 1 tablet (80 mg) by mouth daily. 90 tablet 12    Levothyroxine Sodium 100 MCG Oral Tab TAKE 1 TABLET(100 MCG) BY MOUTH DAILY ON AN EMPTY STOMACH 90 tablet 12    Metoprolol Succinate ER 50 MG Oral TABLET SR 24 HR TAKE 1 TABLET(50 MG) BY MOUTH DAILY. DO NOT CHEW OR CRUSH 90 tablet 2    Rivaroxaban (XARELTO) 20 MG Oral Tab. HELD TAKE 1 TABLET BY MOUTH DAILY. (Patient not taking: Reported on 11/06/2016) 30 tablet 12    Spironolactone 25 MG Oral Tab TAKE 1 TABLET(25 MG) BY MOUTH DAILY 30 tablet 12     No current facility-administered medications for this visit.        Lab Results:  Results for Mark Poole, Mark Poole (MRN [id number]) as of 11/06/2016 14:28   Ref. Range 11/06/2016 12:53   WBC Latest Ref Range: 4.30 - 10.00 10*3/uL 4.95   RBC Latest Ref Range: 4.40 - 5.60 10*6/uL 3.75 (L)   Hemoglobin Latest Ref Range: 13.0 - 18.0 g/dL 8.3 (L)   Hematocrit Latest Ref Range: 38 - 50 % 30 (L)   MCV Latest  Ref Range: 81 - 98 fL 79 (L)   MCH Latest Ref Range: 27.3 - 33.6 pg 22.1 (L)   MCHC Latest Ref Range: 32.2 - 36.5 g/dL 27.9 (L)   Platelet Count Latest Ref Range: 150 - 400 10*3/uL 271   RDW-CV Latest Ref Range: 11.6 - 14.4 % 18.8 (H)   Results for Mark Poole, Mark Poole (MRN [id number]) as of 11/06/2016 14:28   Ref. Range 11/06/2016 12:53   Sodium Latest Ref Range: 135 - 145 meq/L 137   Potassium Latest Ref Range: 3.6 - 5.2 meq/L 4.6   Chloride Latest Ref Range: 98 - 108 meq/L 103   Carbon Dioxide, Total Latest Ref Range: 22 - 32 meq/L 27   Anion Gap Latest Ref Range: 4 - 12  7   Glucose Latest Ref Range: 62 - 125 mg/dL 102   Urea Nitrogen Latest Ref Range: 8 - 21 mg/dL 25 (H)   Creatinine Latest Ref Range: 0.51 - 1.18 mg/dL 1.76 (H)   GFR, Calc,  European American Latest Ref Range: >59 mL/min/1.73_m2 39 (L)   GFR, Calc, African American Latest Ref Range: >59 mL/min/1.73_m2 47 (L)   GFR, Information Unknown Calculated GFR in...   Calcium Latest Ref Range: 8.9 - 10.2 mg/dL 9.2           Plan:  Patient is doing better after having a blood transfusion, he is taking iron supplements.   Patient is scheduled to see PCP next week, he needs a Colonoscopy.    Dr. Jabier Mutton came in to see patient, ordered a stress test.     Patient has clinic number to call and make app after colonoscopy.

## 2016-11-06 NOTE — Progress Notes (Signed)
Saw patient briefly today during a nurse visit.  He feels significantly improved and his orthostasis and fatigue has resolved completely since blood transfusion.  He needs colonoscopy given his symptomatic anemia.  I am recommending an exercise treadmill test to rule out symptomatic severe aortic stenosis.  If that is reassuring he can proceed with colonoscopy with appropriate anesthesia.  We will then follow him closely for symptoms or change in his valve velocities, with an echocardiogram 6 months after the last one.

## 2016-11-12 ENCOUNTER — Encounter (HOSPITAL_BASED_OUTPATIENT_CLINIC_OR_DEPARTMENT_OTHER): Payer: Self-pay | Admitting: Family

## 2016-11-12 ENCOUNTER — Other Ambulatory Visit (HOSPITAL_BASED_OUTPATIENT_CLINIC_OR_DEPARTMENT_OTHER): Payer: Self-pay | Admitting: Family

## 2016-11-12 DIAGNOSIS — I350 Nonrheumatic aortic (valve) stenosis: Secondary | ICD-10-CM

## 2016-11-20 ENCOUNTER — Other Ambulatory Visit (HOSPITAL_BASED_OUTPATIENT_CLINIC_OR_DEPARTMENT_OTHER): Payer: Self-pay | Admitting: Orthopaedic Surgery

## 2016-11-23 ENCOUNTER — Other Ambulatory Visit (HOSPITAL_BASED_OUTPATIENT_CLINIC_OR_DEPARTMENT_OTHER): Payer: Self-pay | Admitting: Internal Medicine

## 2016-11-23 DIAGNOSIS — I482 Chronic atrial fibrillation, unspecified: Secondary | ICD-10-CM

## 2016-11-25 MED ORDER — ATORVASTATIN CALCIUM 80 MG OR TABS
ORAL_TABLET | ORAL | 1 refills | Status: DC
Start: 2016-11-25 — End: 2017-07-13

## 2016-12-04 ENCOUNTER — Encounter (HOSPITAL_BASED_OUTPATIENT_CLINIC_OR_DEPARTMENT_OTHER): Payer: Medicare HMO | Admitting: Internal Medicine

## 2016-12-09 ENCOUNTER — Ambulatory Visit (HOSPITAL_BASED_OUTPATIENT_CLINIC_OR_DEPARTMENT_OTHER): Admit: 2016-12-09 | Discharge: 2016-12-09 | Disposition: A | Discharge status: Home / Self Care | Payer: Medicare HMO

## 2016-12-22 ENCOUNTER — Ambulatory Visit (HOSPITAL_BASED_OUTPATIENT_CLINIC_OR_DEPARTMENT_OTHER): Payer: Medicare HMO

## 2016-12-22 ENCOUNTER — Encounter (HOSPITAL_BASED_OUTPATIENT_CLINIC_OR_DEPARTMENT_OTHER): Payer: Medicare HMO | Admitting: Orthopaedic Surgery

## 2017-01-12 ENCOUNTER — Ambulatory Visit: Payer: Medicare HMO | Attending: Radiation Oncology | Admitting: Radiation Oncology

## 2017-01-12 ENCOUNTER — Ambulatory Visit (HOSPITAL_BASED_OUTPATIENT_CLINIC_OR_DEPARTMENT_OTHER): Payer: Medicare HMO

## 2017-01-12 ENCOUNTER — Other Ambulatory Visit: Admit: 2017-01-12 | Discharge: 2017-01-12 | Disposition: A | Discharge status: Home / Self Care | Payer: Self-pay

## 2017-01-12 DIAGNOSIS — Z8572 Personal history of non-Hodgkin lymphomas: Secondary | ICD-10-CM | POA: Insufficient documentation

## 2017-01-12 DIAGNOSIS — I350 Nonrheumatic aortic (valve) stenosis: Secondary | ICD-10-CM | POA: Insufficient documentation

## 2017-01-12 DIAGNOSIS — E039 Hypothyroidism, unspecified: Secondary | ICD-10-CM | POA: Insufficient documentation

## 2017-01-12 DIAGNOSIS — I4891 Unspecified atrial fibrillation: Secondary | ICD-10-CM | POA: Insufficient documentation

## 2017-01-12 DIAGNOSIS — C4499 Other specified malignant neoplasm of skin, unspecified: Secondary | ICD-10-CM

## 2017-01-12 DIAGNOSIS — D485 Neoplasm of uncertain behavior of skin: Secondary | ICD-10-CM

## 2017-01-12 DIAGNOSIS — C4912 Malignant neoplasm of connective and soft tissue of left upper limb, including shoulder: Secondary | ICD-10-CM | POA: Insufficient documentation

## 2017-01-12 DIAGNOSIS — I10 Essential (primary) hypertension: Secondary | ICD-10-CM | POA: Insufficient documentation

## 2017-01-12 LAB — PATHOLOGY, SURGICAL

## 2017-01-16 ENCOUNTER — Ambulatory Visit (HOSPITAL_BASED_OUTPATIENT_CLINIC_OR_DEPARTMENT_OTHER): Payer: Medicare HMO

## 2017-01-20 ENCOUNTER — Ambulatory Visit: Payer: Medicare HMO | Attending: Student in an Organized Health Care Education/Training Program

## 2017-01-20 ENCOUNTER — Other Ambulatory Visit: Payer: Self-pay | Admitting: Student in an Organized Health Care Education/Training Program

## 2017-01-20 DIAGNOSIS — C44609 Unspecified malignant neoplasm of skin of left upper limb, including shoulder: Secondary | ICD-10-CM | POA: Insufficient documentation

## 2017-02-09 ENCOUNTER — Ambulatory Visit (HOSPITAL_BASED_OUTPATIENT_CLINIC_OR_DEPARTMENT_OTHER): Payer: Medicare HMO

## 2017-02-09 ENCOUNTER — Ambulatory Visit: Payer: Medicare HMO | Attending: Orthopaedic Surgery | Admitting: Orthopaedic Surgery

## 2017-02-09 DIAGNOSIS — C4499 Other specified malignant neoplasm of skin, unspecified: Secondary | ICD-10-CM

## 2017-02-09 DIAGNOSIS — I350 Nonrheumatic aortic (valve) stenosis: Secondary | ICD-10-CM | POA: Insufficient documentation

## 2017-02-09 DIAGNOSIS — Z8572 Personal history of non-Hodgkin lymphomas: Secondary | ICD-10-CM | POA: Insufficient documentation

## 2017-02-09 DIAGNOSIS — C4912 Malignant neoplasm of connective and soft tissue of left upper limb, including shoulder: Secondary | ICD-10-CM | POA: Insufficient documentation

## 2017-02-09 DIAGNOSIS — I10 Essential (primary) hypertension: Secondary | ICD-10-CM | POA: Insufficient documentation

## 2017-02-09 DIAGNOSIS — I4891 Unspecified atrial fibrillation: Secondary | ICD-10-CM | POA: Insufficient documentation

## 2017-02-09 DIAGNOSIS — E039 Hypothyroidism, unspecified: Secondary | ICD-10-CM | POA: Insufficient documentation

## 2017-02-09 DIAGNOSIS — E785 Hyperlipidemia, unspecified: Secondary | ICD-10-CM | POA: Insufficient documentation

## 2017-02-09 LAB — BASIC METABOLIC PANEL
Anion Gap: 7 (ref 4–12)
Calcium: 9.3 mg/dL (ref 8.9–10.2)
Carbon Dioxide, Total: 29 meq/L (ref 22–32)
Chloride: 104 meq/L (ref 98–108)
Creatinine: 1.19 mg/dL — ABNORMAL HIGH (ref 0.51–1.18)
GFR, Calc, African American: >60 mL/min/{1.73_m2} (ref >59)
GFR, Calc, European American: >60 mL/min/{1.73_m2} (ref >59)
Glucose: 101 mg/dL (ref 62–125)
Potassium: 4.5 meq/L (ref 3.6–5.2)
Sodium: 140 meq/L (ref 135–145)
Urea Nitrogen: 19 mg/dL (ref 8–21)

## 2017-02-09 LAB — CBC (HEMOGRAM)
Hematocrit: 40 % (ref 38–50)
Hemoglobin: 13 g/dL (ref 13.0–18.0)
MCH: 30.5 pg (ref 27.3–33.6)
MCHC: 32.7 g/dL (ref 32.2–36.5)
MCV: 93 fL (ref 81–98)
Platelet Count: 187 10*3/uL (ref 150–400)
RBC: 4.26 10*6/uL — ABNORMAL LOW (ref 4.40–5.60)
RDW-CV: 19.9 % — ABNORMAL HIGH (ref 11.6–14.4)
WBC: 5.24 10*3/uL (ref 4.3–10.0)

## 2017-02-09 LAB — PROTHROMBIN & PTT
Partial Thromboplastin Time: 28 s (ref 22–35)
Prothrombin INR: 1 (ref 0.8–1.3)
Prothrombin Time Patient: 13.3 s (ref 10.7–15.6)

## 2017-02-11 ENCOUNTER — Ambulatory Visit: Payer: Medicare HMO

## 2017-03-13 ENCOUNTER — Ambulatory Visit (HOSPITAL_BASED_OUTPATIENT_CLINIC_OR_DEPARTMENT_OTHER): Payer: Medicare HMO

## 2017-03-13 ENCOUNTER — Encounter (HOSPITAL_BASED_OUTPATIENT_CLINIC_OR_DEPARTMENT_OTHER): Payer: Medicare HMO | Admitting: Orthopaedic Surgery

## 2017-04-10 ENCOUNTER — Ambulatory Visit (HOSPITAL_BASED_OUTPATIENT_CLINIC_OR_DEPARTMENT_OTHER): Payer: Medicare HMO

## 2017-04-10 ENCOUNTER — Encounter (HOSPITAL_BASED_OUTPATIENT_CLINIC_OR_DEPARTMENT_OTHER): Payer: Medicare HMO | Admitting: Orthopaedic Surgery

## 2017-04-24 ENCOUNTER — Inpatient Hospital Stay: Payer: Self-pay

## 2017-05-07 ENCOUNTER — Ambulatory Visit (HOSPITAL_COMMUNITY): Admission: RE | Admit: 2017-05-07 | Payer: Medicare HMO | Source: Home / Self Care | Admitting: Orthopaedic Surgery

## 2017-06-10 ENCOUNTER — Other Ambulatory Visit (HOSPITAL_BASED_OUTPATIENT_CLINIC_OR_DEPARTMENT_OTHER): Payer: Medicare HMO

## 2017-06-10 ENCOUNTER — Encounter (HOSPITAL_BASED_OUTPATIENT_CLINIC_OR_DEPARTMENT_OTHER): Payer: Medicare HMO

## 2017-06-12 ENCOUNTER — Encounter (HOSPITAL_BASED_OUTPATIENT_CLINIC_OR_DEPARTMENT_OTHER): Payer: Medicare HMO | Admitting: Orthopaedic Surgery

## 2017-06-16 ENCOUNTER — Emergency Department (HOSPITAL_BASED_OUTPATIENT_CLINIC_OR_DEPARTMENT_OTHER)
Admission: EM | Admit: 2017-06-16 | Discharge: 2017-06-16 | Disposition: A | Discharge status: Home / Self Care | Payer: Medicare Other | Attending: Emergency Medicine | Admitting: Emergency Medicine

## 2017-06-16 ENCOUNTER — Encounter (HOSPITAL_BASED_OUTPATIENT_CLINIC_OR_DEPARTMENT_OTHER): Payer: Self-pay | Admitting: Emergency Medicine

## 2017-06-16 ENCOUNTER — Other Ambulatory Visit: Payer: Self-pay

## 2017-06-16 ENCOUNTER — Emergency Department (HOSPITAL_BASED_OUTPATIENT_CLINIC_OR_DEPARTMENT_OTHER): Payer: Medicare Other

## 2017-06-16 DIAGNOSIS — Z79899 Other long term (current) drug therapy: Secondary | ICD-10-CM | POA: Insufficient documentation

## 2017-06-16 DIAGNOSIS — Z85118 Personal history of other malignant neoplasm of bronchus and lung: Secondary | ICD-10-CM | POA: Diagnosis not present

## 2017-06-16 DIAGNOSIS — R509 Fever, unspecified: Secondary | ICD-10-CM | POA: Diagnosis present

## 2017-06-16 DIAGNOSIS — Z87891 Personal history of nicotine dependence: Secondary | ICD-10-CM | POA: Diagnosis not present

## 2017-06-16 DIAGNOSIS — J449 Chronic obstructive pulmonary disease, unspecified: Secondary | ICD-10-CM | POA: Insufficient documentation

## 2017-06-16 HISTORY — DX: Malignant neoplasm of unspecified part of unspecified bronchus or lung: C34.90

## 2017-06-16 HISTORY — DX: Abdominal aortic aneurysm, without rupture: I71.4

## 2017-06-16 HISTORY — DX: Elevated prostate specific antigen (PSA): R97.20

## 2017-06-16 HISTORY — DX: Abdominal aortic aneurysm, without rupture, unspecified: I71.40

## 2017-06-16 MED ORDER — PREDNISONE 10 MG PO TABS
60.0000 mg | ORAL_TABLET | Freq: Once | ORAL | Status: AC
  Administered 2017-06-16: 60 mg via ORAL
  Filled 2017-06-16: qty 1

## 2017-06-16 MED ORDER — ALBUTEROL SULFATE HFA 108 (90 BASE) MCG/ACT IN AERS: 1.0000 | INHALATION_SPRAY | RESPIRATORY_TRACT | 0 refills | Status: AC | PRN

## 2017-06-16 MED ORDER — IPRATROPIUM-ALBUTEROL 0.5-2.5 (3) MG/3ML IN SOLN
3.0000 mL | Freq: Four times a day (QID) | RESPIRATORY_TRACT | Status: DC
  Administered 2017-06-16: 3 mL via RESPIRATORY_TRACT
  Filled 2017-06-16: qty 3

## 2017-06-16 MED ORDER — PREDNISONE 50 MG PO TABS: 50.0000 mg | ORAL_TABLET | Freq: Every day | ORAL | 0 refills | Status: AC

## 2017-06-16 NOTE — ED Triage Notes (Signed)
Body aches, cough and low grade fever x3 days.

## 2017-06-16 NOTE — Discharge Instructions (Addendum)
You were seen in the emergency department today for cough.  Your chest x-ray did not show findings of pneumonia.  You received a breathing treatment in the emergency department with some improvement.  We have given you a dose of prednisone, this is a steroid.  Additionally we have given you a prescription for 4 days of the steroid and a refill of your albuterol inhaler.  Please use the inhaler every 4-6 hours for the next couple of days.  Follow-up with your primary care doctor in the next 3-5 days for reevaluation.  Return to the emergency department for new or worsening symptoms including but not limited to difficulty breathing, chest pain, or any other concerns she may have.

## 2017-06-16 NOTE — ED Provider Notes (Signed)
[facility] EMERGENCY DEPARTMENT Provider Note   CSN: [id number] Arrival date & time: 06/16/17  0911   History   Chief Complaint Chief Complaint  Patient presents with  . URI symptoms    HPI Howard Clark is a 67 y.o. male with a history of COPD, GERD, and lung cancer status post left upper lobe resection in 2017 in remission who presents the emergency department today complaining of URI symptoms for the past 36 hours.  Patient states that he is having subjective fevers, chills, and dry cough.  States he has tried Tourist information centre manager, Human resources officer, Advil, and Robitussin.  States the Advil seemed to improve his discomfort in his back which is chronic and somewhat flared with coughing fits.  States he is having some chest pain, dyspnea, and sore throat only with coughing otherwise he is not experiencing any chest pain or difficulty breathing.  Patient has home oxygen for PRN use.  Denies vomiting, diarrhea, or abdominal pain.  HPI  Past Medical History:  Diagnosis Date  . Abdominal aneurysm ([place])   . COPD (chronic obstructive pulmonary disease) ([place])   . Elevated PSA   . GERD (gastroesophageal reflux disease)   . Lung cancer ([place])   . Thyroid disease     There are no active problems to display for this patient.   Past Surgical History:  Procedure Laterality Date  . HERNIA REPAIR    . LUNG CANCER SURGERY          Home Medications    Prior to Admission medications   Medication Sig Start Date End Date Taking? Authorizing Provider  atorvastatin (LIPITOR) 10 MG tablet Take 10 mg by mouth daily.   Yes [provider]  omeprazole (PRILOSEC) 20 MG capsule Take 20 mg by mouth 2 (two) times daily before a meal.   Yes [provider]  tiotropium (SPIRIVA) 18 MCG inhalation capsule Place 18 mcg into inhaler and inhale daily.   Yes [provider]  azithromycin (ZITHROMAX) 250 MG tablet Take by mouth daily.    [provider]  budesonide-formoterol  (SYMBICORT) 160-4.5 MCG/ACT inhaler Inhale 2 puffs into the lungs 2 (two) times daily.    [provider]  clidinium-chlordiazePOXIDE (LIBRAX) 5-2.5 MG per capsule Take 1 capsule by mouth.    [provider]  dextromethorphan-guaiFENesin (MUCINEX DM) 30-600 MG per 12 hr tablet Take 1 tablet by mouth 2 (two) times daily.    [provider]  doxycycline (VIBRAMYCIN) 100 MG capsule Take 1 capsule (100 mg total) by mouth 2 (two) times daily. One po bid x 7 days 03/03/13   Molpus, John, MD  fexofenadine (ALLEGRA) 180 MG tablet Take 180 mg by mouth daily.    [provider]  fluticasone (FLONASE) 50 MCG/ACT nasal spray Place 2 sprays into both nostrils daily.    [provider]  levothyroxine (SYNTHROID, LEVOTHROID) 137 MCG tablet Take 137 mcg by mouth daily before breakfast.    [provider]  methylPREDNISolone (MEDROL DOSEPAK) 4 MG tablet Take per package instructions. 03/03/13   Molpus, John, MD  pantoprazole (PROTONIX) 40 MG tablet Take 40 mg by mouth daily.    [provider]  sertraline (ZOLOFT) 25 MG tablet Take 25 mg by mouth daily.    [provider]    Family History History reviewed. No pertinent family history.  Social History Social History   Tobacco Use  . Smoking status: Former Smoker    Packs/day: 1.00    Types: Cigarettes  . Smokeless  tobacco: Never Used  Substance Use Topics  . Alcohol use: No  . Drug use: No     Allergies   Patient has no known allergies.   Review of Systems Review of Systems  Constitutional: Positive for chills and fever (subjective).  HENT: Positive for sore throat (with coughing). Negative for congestion and ear pain.   Respiratory: Positive for cough and shortness of breath (only with coughing).   Cardiovascular: Positive for chest pain (only with coughing).  Gastrointestinal: Negative for abdominal pain, diarrhea and vomiting.  Musculoskeletal: Positive for back pain  (chronic, receives injections q 4 months) and myalgias.    Physical Exam Updated Vital Signs BP 116/71 (BP Location: Left Arm)   Pulse 91   Temp 100 F (37.8 C) (Oral)   Resp 20   Ht 6' (1.829 m)   Wt 86.2 kg (190 lb)   SpO2 91% Comment: Pt has O2 at home for PRN use  BMI 25.77 kg/m   Physical Exam  Constitutional: He appears well-developed and well-nourished.  Non-toxic appearance. No distress.  HENT:  Head: Normocephalic and atraumatic.  Right Ear: Tympanic membrane is not erythematous, not retracted and not bulging.  Left Ear: Tympanic membrane is not erythematous, not retracted and not bulging.  Nose: Nose normal. Right sinus exhibits no maxillary sinus tenderness and no frontal sinus tenderness. Left sinus exhibits no maxillary sinus tenderness and no frontal sinus tenderness.  Mouth/Throat: Uvula is midline and oropharynx is clear and moist. No oropharyngeal exudate or posterior oropharyngeal erythema.  Eyes: Pupils are equal, round, and reactive to light. Conjunctivae are normal. Right eye exhibits no discharge. Left eye exhibits no discharge.  Neck: Neck supple.  Cardiovascular: Normal rate and regular rhythm.  No murmur heard. Pulmonary/Chest: Effort normal. No respiratory distress. He has wheezes (minimal ). He has no rhonchi. He has no rales.  Abdominal: Soft. He exhibits no distension. There is no tenderness.  Lymphadenopathy:    He has no cervical adenopathy.  Neurological: He is alert.  Clear speech.   Skin: Skin is warm and dry. No rash noted.  Psychiatric: He has a normal mood and affect. His behavior is normal.  Nursing note and vitals reviewed.   ED Treatments / Results  Labs (all labs ordered are listed, but only abnormal results are displayed) Labs Reviewed - No data to display  EKG None  Radiology Dg Chest 2 View  Result Date: 06/16/2017 CLINICAL DATA:  Cough, fever and chest congestion over the last 2 days. EXAM: CHEST - 2 VIEW COMPARISON:  CT  03/30/2017 FINDINGS: Heart size is normal. Mediastinal shadows are normal. There has been previous left upper lobectomy. Lungs are clear, with upper lobe emphysematous changes. No infiltrate, collapse or effusion. Ordinary degenerative changes affect the spine. IMPRESSION: No active disease.  Previous left upper lobectomy.  Emphysema. Electronically Signed   By: Nelson Chimes M.D.   On: 06/16/2017 10:23   Procedures Procedures (including critical care time)  Medications Ordered in ED Medications - No data to display  Initial Impression / Assessment and Plan / ED Course  I have reviewed the triage vital signs and the nursing notes.  Pertinent labs & imaging results that were available during my care of the patient were reviewed by me and considered in my medical decision making (see chart for details).   Patient presents with URI sxs over the past 36 hours. He is nontoxic appearing, vitals WNL- SpO2 91% on RA- patient with home O2 PRN. Patient's lungs with  minimal wheeze. Will evaluate with CXR and administer DuoNeb.   CXR notable for previous left upper lobectomy and emphysema. CXR without infiltrate, doubt pneumonia. Patient feeling improved following DuoNeb. Will give prednisone in the ED and DC home with steroid burst and refill of patient's albuterol inhaler. Discussed results, treatment plan, need for PCP follow-up, and return precautions with the patient. Provided opportunity for questions, patient confirmed understanding and is in agreement with plan.   Findings and plan of care discussed with supervising physician Dr. Tyrone Nine who personally evaluated and examined this patient and is in agreement with plan.   Vitals:   06/16/17 0929 06/16/17 1127  BP: 116/71   Pulse: 91   Resp: 20   Temp: 100 F (37.8 C)   SpO2: 91% 93%    Final Clinical Impressions(s) / ED Diagnoses   Final diagnoses:  Chronic obstructive pulmonary disease, unspecified COPD type [facility])    ED Discharge Orders          Ordered    predniSONE (DELTASONE) 50 MG tablet  Daily     06/16/17 1150    albuterol (PROVENTIL HFA;VENTOLIN HFA) 108 (90 Base) MCG/ACT inhaler  Every 4 hours PRN     06/16/17 1150       Shiela Bruns, [place] R, PA-C 06/16/17 [place], [place], DO 06/16/17 1243

## 2017-07-13 ENCOUNTER — Other Ambulatory Visit (HOSPITAL_BASED_OUTPATIENT_CLINIC_OR_DEPARTMENT_OTHER): Payer: Self-pay | Admitting: Internal Medicine

## 2017-07-13 DIAGNOSIS — I482 Chronic atrial fibrillation, unspecified: Secondary | ICD-10-CM

## 2017-07-14 MED ORDER — ATORVASTATIN CALCIUM 80 MG OR TABS
ORAL_TABLET | ORAL | 0 refills | Status: AC
Start: 2017-07-14 — End: ?

## 2017-07-20 ENCOUNTER — Ambulatory Visit: Payer: Medicare HMO | Attending: Orthopaedic Surgery

## 2017-07-20 ENCOUNTER — Other Ambulatory Visit: Payer: Self-pay | Admitting: Orthopaedic Surgery

## 2017-07-20 ENCOUNTER — Ambulatory Visit (HOSPITAL_BASED_OUTPATIENT_CLINIC_OR_DEPARTMENT_OTHER): Payer: Medicare HMO | Admitting: Orthopaedic Surgery

## 2017-07-20 ENCOUNTER — Encounter (HOSPITAL_BASED_OUTPATIENT_CLINIC_OR_DEPARTMENT_OTHER): Payer: Self-pay | Admitting: Orthopaedic Surgery

## 2017-07-20 ENCOUNTER — Other Ambulatory Visit (HOSPITAL_BASED_OUTPATIENT_CLINIC_OR_DEPARTMENT_OTHER): Payer: Medicare HMO

## 2017-07-20 ENCOUNTER — Ambulatory Visit (HOSPITAL_BASED_OUTPATIENT_CLINIC_OR_DEPARTMENT_OTHER): Payer: Medicare HMO

## 2017-07-20 DIAGNOSIS — I4891 Unspecified atrial fibrillation: Secondary | ICD-10-CM

## 2017-07-20 DIAGNOSIS — C4489 Other specified malignant neoplasm of overlapping sites of skin: Secondary | ICD-10-CM | POA: Insufficient documentation

## 2017-07-20 DIAGNOSIS — R918 Other nonspecific abnormal finding of lung field: Secondary | ICD-10-CM

## 2017-07-20 DIAGNOSIS — Z803 Family history of malignant neoplasm of breast: Secondary | ICD-10-CM | POA: Insufficient documentation

## 2017-07-20 DIAGNOSIS — I10 Essential (primary) hypertension: Secondary | ICD-10-CM | POA: Insufficient documentation

## 2017-07-20 DIAGNOSIS — E039 Hypothyroidism, unspecified: Secondary | ICD-10-CM | POA: Insufficient documentation

## 2017-07-20 DIAGNOSIS — Z87891 Personal history of nicotine dependence: Secondary | ICD-10-CM | POA: Insufficient documentation

## 2017-07-20 DIAGNOSIS — E785 Hyperlipidemia, unspecified: Secondary | ICD-10-CM | POA: Insufficient documentation

## 2017-07-20 DIAGNOSIS — C4499 Other specified malignant neoplasm of skin, unspecified: Secondary | ICD-10-CM

## 2017-07-20 DIAGNOSIS — C44609 Unspecified malignant neoplasm of skin of left upper limb, including shoulder: Secondary | ICD-10-CM

## 2017-10-05 ENCOUNTER — Encounter (HOSPITAL_BASED_OUTPATIENT_CLINIC_OR_DEPARTMENT_OTHER): Payer: Self-pay | Admitting: Orthopaedic Surgery

## 2017-10-12 ENCOUNTER — Ambulatory Visit: Payer: Medicare HMO

## 2017-10-13 ENCOUNTER — Other Ambulatory Visit (HOSPITAL_BASED_OUTPATIENT_CLINIC_OR_DEPARTMENT_OTHER): Payer: Self-pay | Admitting: Internal Medicine

## 2017-10-13 DIAGNOSIS — E032 Hypothyroidism due to medicaments and other exogenous substances: Secondary | ICD-10-CM

## 2017-10-13 NOTE — Telephone Encounter (Signed)
The requested medication requires your authorization because it is outside of the [facility] protocols:     No current labs.  Last TSH 07/31/16    If you want to authorize it please indicate refills, sign the order and close the encounter.    If this medication is denied please have your staff inform the patient and schedule an appointment if necessary.

## 2017-10-15 NOTE — Telephone Encounter (Signed)
I will defer mngmnt of hypothyroidism to patient's endocrinologist.

## 2017-10-16 MED ORDER — LEVOTHYROXINE SODIUM 100 MCG OR TABS
ORAL_TABLET | ORAL | 0 refills | Status: DC
Start: 2017-10-16 — End: 2018-01-12

## 2017-10-16 NOTE — Telephone Encounter (Signed)
Needs repeat thyroid labs and follow up visit. Will refill with short supply until follow up can be arranged.

## 2017-10-21 NOTE — Telephone Encounter (Signed)
Left message for patient to call [phone] to schedule follow-up appointment.    When patient calls back: please schedule return appt with Dr.Tylee from recall tab.

## 2017-10-22 ENCOUNTER — Other Ambulatory Visit (HOSPITAL_BASED_OUTPATIENT_CLINIC_OR_DEPARTMENT_OTHER): Payer: Self-pay | Admitting: Orthopaedic Surgery

## 2017-10-22 ENCOUNTER — Ambulatory Visit (HOSPITAL_BASED_OUTPATIENT_CLINIC_OR_DEPARTMENT_OTHER): Payer: Medicare HMO | Admitting: Family Medicine

## 2017-10-22 ENCOUNTER — Ambulatory Visit
Admission: RE | Admit: 2017-10-22 | Discharge: 2017-10-22 | Disposition: A | Discharge status: Home / Self Care | Payer: Medicare HMO | Attending: Orthopaedic Surgery | Admitting: Orthopaedic Surgery

## 2017-10-22 DIAGNOSIS — C4912 Malignant neoplasm of connective and soft tissue of left upper limb, including shoulder: Secondary | ICD-10-CM

## 2017-10-22 DIAGNOSIS — E039 Hypothyroidism, unspecified: Secondary | ICD-10-CM | POA: Insufficient documentation

## 2017-10-22 DIAGNOSIS — Z923 Personal history of irradiation: Secondary | ICD-10-CM | POA: Insufficient documentation

## 2017-10-22 DIAGNOSIS — Z9221 Personal history of antineoplastic chemotherapy: Secondary | ICD-10-CM | POA: Insufficient documentation

## 2017-10-22 DIAGNOSIS — L905 Scar conditions and fibrosis of skin: Secondary | ICD-10-CM

## 2017-10-22 DIAGNOSIS — I130 Hypertensive heart and chronic kidney disease with heart failure and stage 1 through stage 4 chronic kidney disease, or unspecified chronic kidney disease: Secondary | ICD-10-CM | POA: Insufficient documentation

## 2017-10-22 DIAGNOSIS — N189 Chronic kidney disease, unspecified: Secondary | ICD-10-CM | POA: Insufficient documentation

## 2017-10-22 DIAGNOSIS — Z954 Presence of other heart-valve replacement: Secondary | ICD-10-CM | POA: Insufficient documentation

## 2017-10-22 DIAGNOSIS — Z8572 Personal history of non-Hodgkin lymphomas: Secondary | ICD-10-CM | POA: Insufficient documentation

## 2017-10-22 LAB — TYPE AND SCREEN
ABO/Rh: A POS
Antibody Screen: NEGATIVE

## 2017-10-22 LAB — PROTHROMBIN TIME
Prothrombin INR: 1.3 (ref 0.8–1.3)
Prothrombin Time Patient: 16.6 s — ABNORMAL HIGH (ref 10.7–15.6)

## 2017-10-22 LAB — BLOOD TYPE CONFIRMATION: ABO/Rh: A POS

## 2017-10-22 LAB — GLUCOSE POC, ~~LOC~~: Glucose (POC): 102 mg/dL (ref 62–125)

## 2017-10-22 LAB — HEMOGLOBIN AND HEMATOCRIT
Hematocrit: 35 % — ABNORMAL LOW (ref 38–50)
Hemoglobin: 10.7 g/dL — ABNORMAL LOW (ref 13.0–18.0)

## 2017-10-27 LAB — PATHOLOGY, SURGICAL

## 2017-11-06 ENCOUNTER — Ambulatory Visit: Payer: Medicare HMO | Attending: Medical | Admitting: Medical

## 2017-11-06 ENCOUNTER — Encounter (HOSPITAL_BASED_OUTPATIENT_CLINIC_OR_DEPARTMENT_OTHER): Payer: Self-pay | Admitting: Medical

## 2017-11-06 DIAGNOSIS — Z803 Family history of malignant neoplasm of breast: Secondary | ICD-10-CM | POA: Insufficient documentation

## 2017-11-06 DIAGNOSIS — C4912 Malignant neoplasm of connective and soft tissue of left upper limb, including shoulder: Secondary | ICD-10-CM

## 2017-11-06 DIAGNOSIS — E785 Hyperlipidemia, unspecified: Secondary | ICD-10-CM | POA: Insufficient documentation

## 2017-11-06 DIAGNOSIS — L7632 Postprocedural hematoma of skin and subcutaneous tissue following other procedure: Secondary | ICD-10-CM | POA: Insufficient documentation

## 2017-11-06 DIAGNOSIS — C44609 Unspecified malignant neoplasm of skin of left upper limb, including shoulder: Secondary | ICD-10-CM | POA: Insufficient documentation

## 2017-11-06 DIAGNOSIS — E039 Hypothyroidism, unspecified: Secondary | ICD-10-CM | POA: Insufficient documentation

## 2017-11-06 DIAGNOSIS — Z483 Aftercare following surgery for neoplasm: Secondary | ICD-10-CM | POA: Insufficient documentation

## 2017-11-06 DIAGNOSIS — I4891 Unspecified atrial fibrillation: Secondary | ICD-10-CM | POA: Insufficient documentation

## 2018-01-04 ENCOUNTER — Encounter (HOSPITAL_BASED_OUTPATIENT_CLINIC_OR_DEPARTMENT_OTHER): Payer: Medicare HMO

## 2018-01-05 ENCOUNTER — Encounter (HOSPITAL_BASED_OUTPATIENT_CLINIC_OR_DEPARTMENT_OTHER): Payer: Medicare HMO

## 2018-01-05 ENCOUNTER — Encounter (HOSPITAL_BASED_OUTPATIENT_CLINIC_OR_DEPARTMENT_OTHER): Payer: Medicare HMO | Admitting: Medical

## 2018-01-12 ENCOUNTER — Other Ambulatory Visit (HOSPITAL_BASED_OUTPATIENT_CLINIC_OR_DEPARTMENT_OTHER): Payer: Self-pay | Admitting: Endocrinology

## 2018-01-12 DIAGNOSIS — E032 Hypothyroidism due to medicaments and other exogenous substances: Secondary | ICD-10-CM

## 2018-01-12 NOTE — Telephone Encounter (Signed)
LVM for patient to call clinic to schedule appointment.    For dr Jabier Mutton recall

## 2018-01-15 MED ORDER — LEVOTHYROXINE SODIUM 100 MCG OR TABS
ORAL_TABLET | ORAL | 0 refills | Status: AC
Start: 2018-01-15 — End: ?

## 2018-01-18 ENCOUNTER — Ambulatory Visit: Payer: Medicare HMO | Attending: Medical | Admitting: Medical

## 2018-01-18 ENCOUNTER — Encounter (HOSPITAL_BASED_OUTPATIENT_CLINIC_OR_DEPARTMENT_OTHER): Payer: Self-pay | Admitting: Medical

## 2018-01-18 DIAGNOSIS — I509 Heart failure, unspecified: Secondary | ICD-10-CM | POA: Insufficient documentation

## 2018-01-18 DIAGNOSIS — Z85831 Personal history of malignant neoplasm of soft tissue: Secondary | ICD-10-CM | POA: Insufficient documentation

## 2018-01-18 DIAGNOSIS — Z08 Encounter for follow-up examination after completed treatment for malignant neoplasm: Secondary | ICD-10-CM | POA: Insufficient documentation

## 2018-01-18 DIAGNOSIS — C4912 Malignant neoplasm of connective and soft tissue of left upper limb, including shoulder: Secondary | ICD-10-CM

## 2018-01-18 DIAGNOSIS — C4499 Other specified malignant neoplasm of skin, unspecified: Secondary | ICD-10-CM

## 2018-01-18 DIAGNOSIS — K862 Cyst of pancreas: Secondary | ICD-10-CM | POA: Insufficient documentation

## 2018-07-20 ENCOUNTER — Encounter (HOSPITAL_BASED_OUTPATIENT_CLINIC_OR_DEPARTMENT_OTHER): Payer: Medicare HMO | Admitting: Medical

## 2018-07-20 ENCOUNTER — Encounter (HOSPITAL_BASED_OUTPATIENT_CLINIC_OR_DEPARTMENT_OTHER): Payer: Medicare HMO

## 2018-08-31 ENCOUNTER — Encounter (HOSPITAL_BASED_OUTPATIENT_CLINIC_OR_DEPARTMENT_OTHER): Payer: Medicare HMO | Admitting: Medical

## 2018-08-31 ENCOUNTER — Encounter (HOSPITAL_BASED_OUTPATIENT_CLINIC_OR_DEPARTMENT_OTHER): Payer: Medicare HMO

## 2019-07-08 ENCOUNTER — Inpatient Hospital Stay: Payer: Self-pay

## 2019-07-14 ENCOUNTER — Other Ambulatory Visit (HOSPITAL_BASED_OUTPATIENT_CLINIC_OR_DEPARTMENT_OTHER): Payer: Self-pay | Admitting: Medical

## 2019-07-14 DIAGNOSIS — C499 Malignant neoplasm of connective and soft tissue, unspecified: Secondary | ICD-10-CM

## 2019-07-26 ENCOUNTER — Encounter (HOSPITAL_BASED_OUTPATIENT_CLINIC_OR_DEPARTMENT_OTHER): Payer: Self-pay | Admitting: Orthopaedic Surgery

## 2019-07-27 ENCOUNTER — Encounter (HOSPITAL_BASED_OUTPATIENT_CLINIC_OR_DEPARTMENT_OTHER): Payer: Medicare HMO

## 2019-07-27 ENCOUNTER — Encounter (HOSPITAL_BASED_OUTPATIENT_CLINIC_OR_DEPARTMENT_OTHER): Payer: Medicare HMO | Admitting: Medical

## 2019-08-03 ENCOUNTER — Encounter (HOSPITAL_BASED_OUTPATIENT_CLINIC_OR_DEPARTMENT_OTHER): Payer: Medicare HMO | Admitting: Medical

## 2019-08-03 ENCOUNTER — Encounter (HOSPITAL_BASED_OUTPATIENT_CLINIC_OR_DEPARTMENT_OTHER): Payer: Medicare HMO

## 2019-08-09 ENCOUNTER — Ambulatory Visit (HOSPITAL_BASED_OUTPATIENT_CLINIC_OR_DEPARTMENT_OTHER): Payer: Medicare HMO | Admitting: Medical

## 2019-08-09 ENCOUNTER — Ambulatory Visit
Admit: 2019-08-09 | Discharge: 2019-08-09 | Disposition: A | Discharge status: Home / Self Care | Payer: Medicare HMO | Attending: Diagnostic Radiology | Admitting: Diagnostic Radiology

## 2019-08-09 DIAGNOSIS — C499 Malignant neoplasm of connective and soft tissue, unspecified: Secondary | ICD-10-CM | POA: Insufficient documentation

## 2019-08-09 NOTE — Progress Notes (Signed)
ORTHOPAEDIC ONCOLOGY - RETURN VISIT     CHIEF COMPLAINT   Surveillance visit with review of CT of the chest.       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 He underwent wide excision of the area on 10/22/17 with Dr. Grandville Silos.        INTERVAL HISTORY  Today he reports he is doing well with no concerns with regards to his left upper extremity.        REVIEW OF SYSTEMS  Positive:  - anemia of unknown cause (iron infusions, transfusions).   Pertinent Negative:  -  denies fevers, chills, diaphoresis, nausea, vomiting, chest pain, shortness of breath, difficulty breathing, changes in weight or appetite, night sweats, changes in bowel or bladder function, additional masses or skin changes, pain elsewhere in the body, additional neurologic/sensory/motor deficits.      Except as noted in the HPI above, all other systems are negative.     PERTINENT MEDICATIONS    Please refer to complete medication reconciliation which was reviewed.      ALLERGIES   Review of patient's allergies indicates:  No Known Allergies      RELEVANT HISTORY   Medical - atrial fib, aortic stenosis, CHF, hyperlipidemia, hypothyroid, non-hodgkin's lymphoma s/p chemo and XRT, HT  Surgical - aortic valve replacement  Social -  lives in [place], and is a retired Games developer. He is planning on moving to [facility], next year to live on the farm that belonged to his grandparents.      PHYSICAL EXAM   VITALS -   Vitals:    08/09/19 1421   Temp: 36.6 C   Pulse: 69   BP: 139/84   Resp: 18   SpO2: 98%   Height: 5' 9.61" (1.768 m)   Weight: 77.1 kg (169 lb 15.6 oz)      GENERAL-no apparent distress, alert and oriented  PSYCHIATRIC -pleasant and appropriate mood and affect  NEUROLOGIC -left upper extremity is neurovascularly intact, in sensory and motor exam  CARDIOVASCULAR -warm and well-perfused extremities, no distal  edema  RESPIRATORY -nonlabored breathing  INTEGUMENTARY -no skin lesions, rashes, or wounds  MUSCULOSKELETAL   - Gait/Station -ambulates well with a normal gait, unassisted  - LUE -the left upper arm reveals a well-healed longitudinal anterior upper arm incision which is soft and supple on palpation.  There are no palpable masses in this area and no pain on palpation.     MEDICAL DECISION MAKING    PATHOLOGY  Date - 10/22/17  Facility -  [facility]  Diagnosis - no residual neoplasm.        IMAGING     CT   CT of the chest shows a large effusion on the right. The official report states " unchanged moderate volume right pleural effusion, no new lung disease to suggest metastasis.  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  This can be further evaluated on dedicated abdominal imaging (CT and MRI) if clinically warranted.     The final radiology report was printed and given to the patient to hand carry to his PCP.    ________________________________________________________________________________________     ASSESSMENT   cutaneous leiomyosarcoma s/p outside non-oncologic excision, now s/p re-excision of the tumor bed by Dr. Grandville Silos on 10/22/17.   -  CHF, and stable lung CT       PLAN  Care Coordination (Orders Today)  - Imaging -CT of the chest  - Follow-up - 6 months     Discussion  - at his next visit he will be past his 2 year anniversary of his excision. We will continue f/u at 6 month intervals until he reaches 5 years of disease free follow up.   -We discussed his chest imaging.  Because he has a large pleural effusion and this has been stable, I think a chest x-ray will be difficult to read and evaluate for metastatic disease.  I recommend continuing with CT of the chest for each surveillance visit.  He is okay with this.  -He should review today's CT chest report with his PCP, Orson Slick.  He was given a copy of  the report.  It seems that the pancreatic changes are not different from several years ago but this would be worked up at the discretion of his PCP.  He continues to have his anemia evaluated and we wish him well with this.  -At his next visit, he will be passed his 2-year anniversary from his surgical excision and we will discuss the possibility of having him see [place] in the [facility] building/sarcoma surveillance clinic after his next visit.

## 2019-11-26 IMAGING — CR DG CHEST 2V
2 series · 2 of 2 positions shown · non-contrast
Comparison: CT 03/30/2017

CLINICAL DATA: Cough, fever and chest congestion over the last 2
days.

EXAM:
CHEST - 2 VIEW

[w chest pa]
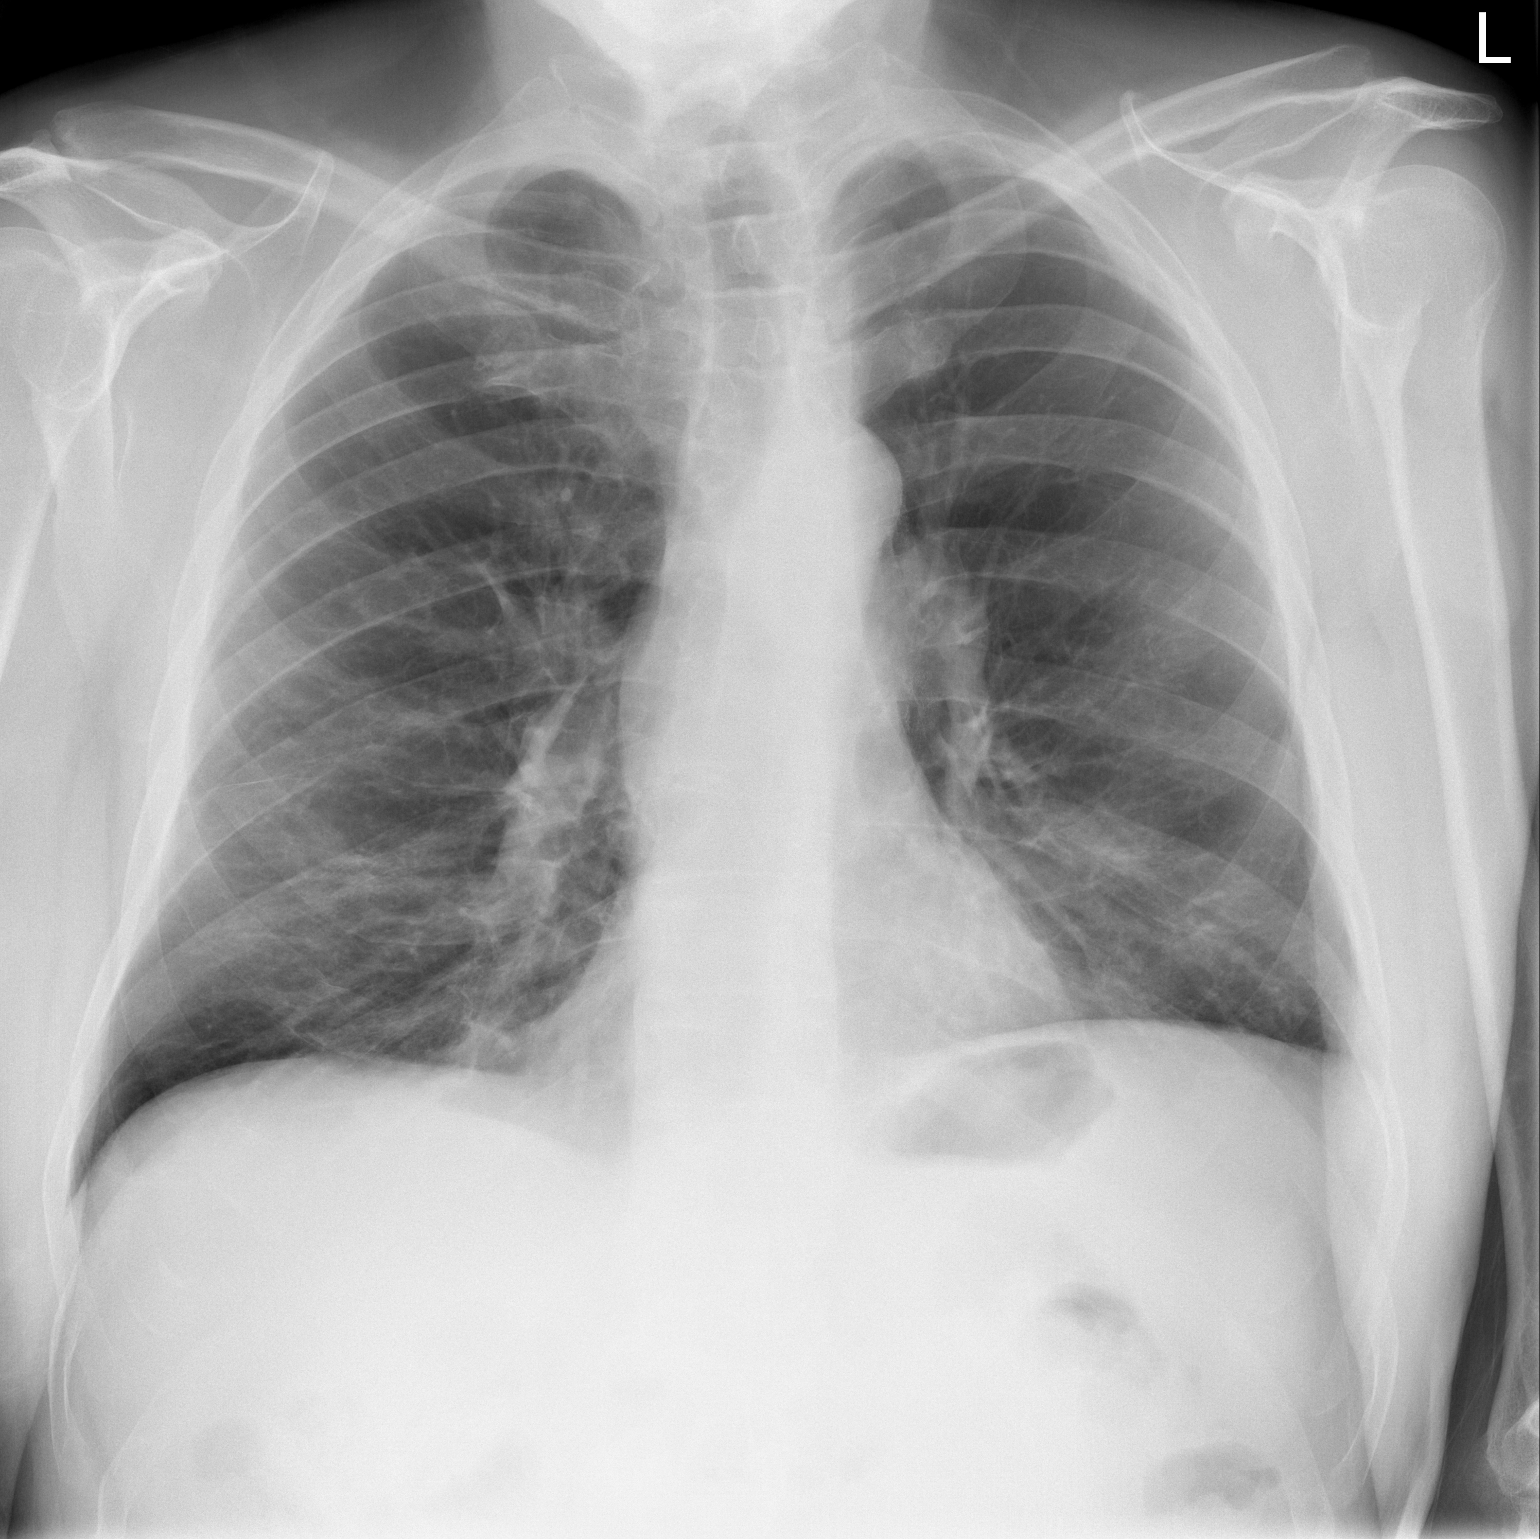

[w chest lat]
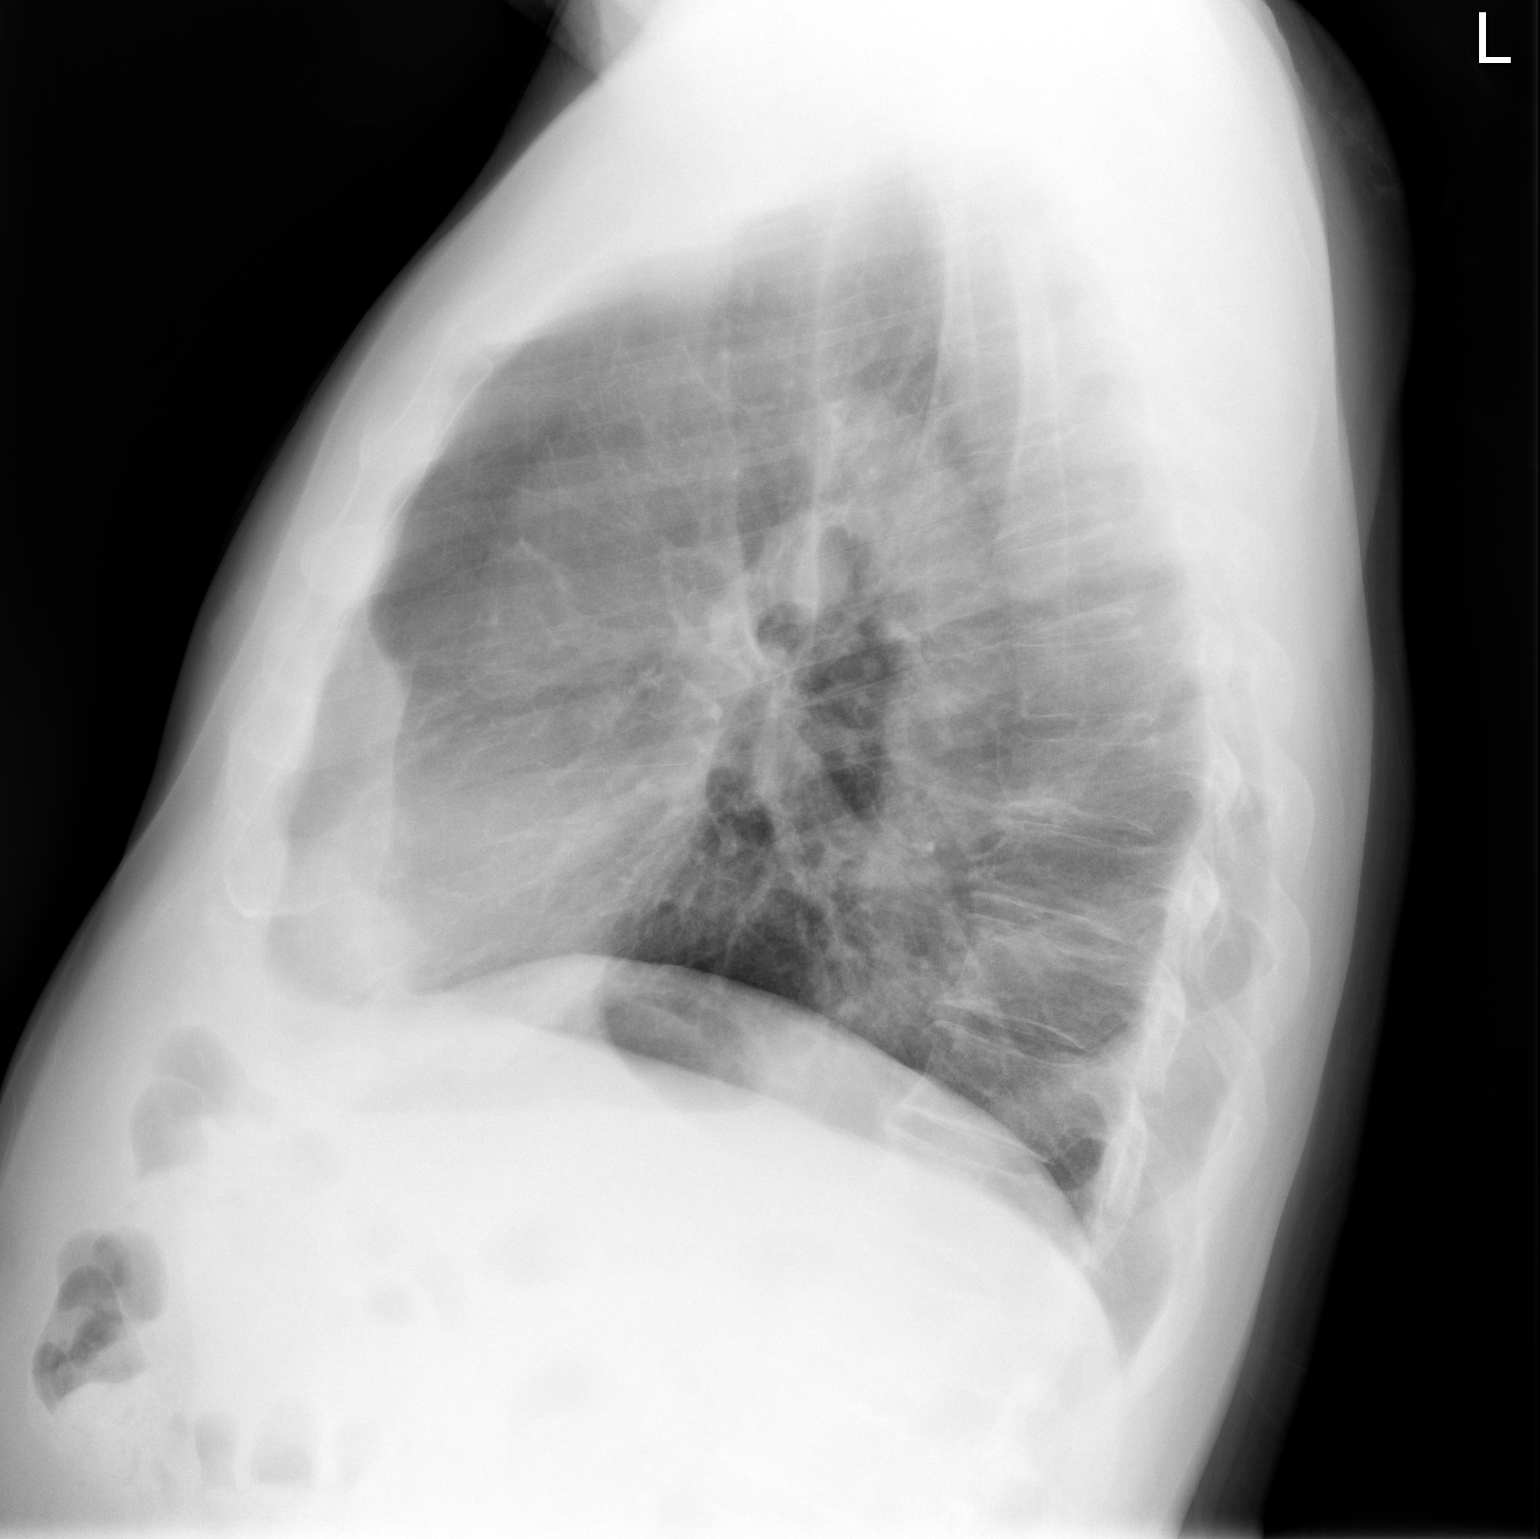

[2 of 2 positions shown; findings below may reference images not displayed]

FINDINGS: Heart size is normal. Mediastinal shadows are normal. There has been
previous left upper lobectomy. Lungs are clear, with upper lobe
emphysematous changes.. No infiltrate, collapse or effusion.
Ordinary degenerative changes affect the spine.
IMPRESSION: No active disease.  Previous left upper lobectomy.  Emphysema.

## 2020-03-07 ENCOUNTER — Encounter (HOSPITAL_BASED_OUTPATIENT_CLINIC_OR_DEPARTMENT_OTHER): Payer: Self-pay | Admitting: Medical

## 2020-03-07 ENCOUNTER — Ambulatory Visit
Admission: RE | Admit: 2020-03-07 | Discharge: 2020-03-07 | Disposition: A | Discharge status: Home / Self Care | Payer: Medicare HMO | Attending: Unknown Physician Specialty | Admitting: Unknown Physician Specialty

## 2020-03-07 DIAGNOSIS — C499 Malignant neoplasm of connective and soft tissue, unspecified: Secondary | ICD-10-CM | POA: Insufficient documentation

## 2020-03-12 ENCOUNTER — Ambulatory Visit: Payer: Medicare HMO | Attending: Medical | Admitting: Medical

## 2020-03-12 VITALS — BP 134/70 | HR 55 | Resp 16 | Wt 153.6 lb

## 2020-03-12 DIAGNOSIS — I509 Heart failure, unspecified: Secondary | ICD-10-CM | POA: Insufficient documentation

## 2020-03-12 DIAGNOSIS — R911 Solitary pulmonary nodule: Secondary | ICD-10-CM | POA: Insufficient documentation

## 2020-03-12 DIAGNOSIS — C499 Malignant neoplasm of connective and soft tissue, unspecified: Secondary | ICD-10-CM | POA: Insufficient documentation

## 2020-03-12 DIAGNOSIS — K862 Cyst of pancreas: Secondary | ICD-10-CM | POA: Insufficient documentation

## 2020-03-12 NOTE — Progress Notes (Signed)
ORTHOPAEDIC ONCOLOGY - RETURN VISIT      CHIEF COMPLAINT    Leiomyosarcoma follow-up with review of CT of the chest and clinical exam of the left upper extremity      HISTORY OF PRESENT ILLNESS    IDENTIFIER    69 year old man who had outside non-oncologic excision of a cutaneous LMS and was referred to [facility] for definitive recommendations. He underwent wide excision of the area on 10/22/17 with Dr. Grandville Silos.      INTERVAL HISTORY   Mr. Manuele reports he has no specific concerns today and no complaints.      REVIEW OF SYSTEMS   Positive:   -Shortness of breath, chronic   Pertinent Negative:   -  denies fevers, chills, diaphoresis, nausea, vomiting, chest pain, shortness of breath, difficulty breathing, changes in weight or appetite, night sweats, changes in bowel or bladder function, additional masses or skin changes, pain elsewhere in the body, additional neurologic/sensory/motor deficits.        Except as noted in the HPI above, all other systems are negative.      PERTINENT MEDICATIONS     Please refer to complete medication reconciliation which was reviewed     ALLERGIES    Review of patient's allergies indicates:  No Known Allergies       RELEVANT HISTORY    Medical - atrial fib, aortic stenosis, CHF, hyperlipidemia, hypothyroid, non-hodgkin's lymphoma s/p chemo and XRT, HT  Surgical - aortic valve replacement  Social -  lives in [place], and is a retired Games developer. He is planning on moving to [facility], next year to live on the farm that belonged to his grandparents.      PHYSICAL EXAM    VITALS -   Vitals:    03/12/20 1000   Pulse: (!) 55   BP: 134/70   Resp: 16   SpO2: 92%   Weight: 69.7 kg (153 lb 9.6 oz)   GENERAL-no apparent distress, alert and oriented  PSYCHIATRIC -pleasant and appropriate mood and affect  NEUROLOGIC -left distal upper extremity is neurovascularly intact, in sensory and motor exam  CARDIOVASCULAR -warm and well-perfused extremities, no distal edema  RESPIRATORY -nonlabored  breathing  INTEGUMENTARY -no skin lesions, rashes, or wounds  MUSCULOSKELETAL   - LUE -the left upper extremity has a well-healed longitudinal surgical incision on the left upper arm anteriorly.  There is no pain on palpation and no palpable masses in this area.      MEDICAL DECISION MAKING      PATHOLOGY  Date - 10/22/17  Facility -  [facility]  Diagnosis - no residual neoplasm.        IMAGING   CT    CT of the chest is reviewed and also reviewed with the patient.  This shows right-sided pleural effusion  which appears chronic per the official radiology report.  There is a new 5 mm pulmonary nodule in the right upper lobe which according to the radiology report, is favored to represent infectious or inflammatory nodule.  Metastatic disease cannot be ruled out.  Repeat CT of the chest is recommended in 3 months.  -Multiple cystic pancreatic lesions, and the official radiology report states that he has have slowly increased in size since 2014.  Recommendation is for further evaluation with pancreatic protocol CT or MRI.        ________________________________________________________________________________________      ASSESSMENT    cutaneous leiomyosarcoma s/p outside non-oncologic excision, now s/p re-excision of the tumor  bed by Dr. Grandville Silos on 10/22/17.   - CHF, and stable pleural effusion  -New pulmonary nodule 5 mm in size   -Pancreatic cysts which have increased in size         PLAN   Care Coordination (Orders Today)   - Imaging -CT pancreas protocol   - Follow-up -phone visit afterwards      Discussion   -We discussed his CT scan in detail today and I showed him the significant images.  I discussed that MRI or CT scan could be done to evaluate the pancreatic lesions.  He prefers CT scan as he does have some claustrophobia.  I will order this and we will discuss the results and he would like to wait until the new year for this.   -We will also need to repeat his CT of the chest 3 months from his December 15 CT scan and  we will order that at his next visit with a follow-up in March 2022.

## 2020-04-10 ENCOUNTER — Ambulatory Visit
Admit: 2020-04-10 | Discharge: 2020-04-10 | Disposition: A | Discharge status: Home / Self Care | Payer: Medicare HMO | Attending: Diagnostic Radiology | Admitting: Diagnostic Radiology

## 2020-04-10 ENCOUNTER — Encounter (HOSPITAL_BASED_OUTPATIENT_CLINIC_OR_DEPARTMENT_OTHER): Payer: Self-pay

## 2020-04-10 DIAGNOSIS — K862 Cyst of pancreas: Secondary | ICD-10-CM | POA: Insufficient documentation

## 2020-04-10 MED ORDER — BREEZA NEGATIVE ORAL CONTRAST SOLN
473.0000 mL | Freq: Once | ORAL | Status: DC | PRN
Start: 2020-04-10 — End: 2020-04-11

## 2020-04-10 MED ORDER — IOHEXOL 350 MG/ML IV SOLN
125.0000 mL | Freq: Once | INTRAVENOUS | Status: AC | PRN
Start: 2020-04-10 — End: 2020-04-10
  Administered 2020-04-10: 125 mL via INTRAVENOUS

## 2020-04-12 ENCOUNTER — Ambulatory Visit: Payer: Medicare HMO | Attending: Medical | Admitting: Medical

## 2020-04-12 ENCOUNTER — Encounter (HOSPITAL_BASED_OUTPATIENT_CLINIC_OR_DEPARTMENT_OTHER): Payer: Self-pay

## 2020-04-12 DIAGNOSIS — C499 Malignant neoplasm of connective and soft tissue, unspecified: Secondary | ICD-10-CM

## 2020-04-12 DIAGNOSIS — K862 Cyst of pancreas: Secondary | ICD-10-CM

## 2020-04-12 NOTE — Progress Notes (Signed)
This visit is being conducted over the telephone at the patients request: Yes  Patient gives verbal consent to proceed and knows there may be a copay/deductible: Yes    Time spent with patient/guardian on this telephone visit: 6 minutes    Given the importance of social distancing and other strategies recommended to reduce the risk of COVID-19 transmission, I am providing medical care to this patient via a telephone visit in place of an in person visit at the request of the patient.    Phone call today to Mark Poole to discuss his abdomen CT scan. This showed 3 pancreatic cysts which are unchanged compared with the scan from December 23, 2017. The recommendation is for repeat abdominal CT pancreas protocol in 1 year for stability.    I discussed the results with him and he is happy with the recommendation to reimage his abdomen in 1 year.    He will be due for a short interval follow-up CT of the chest in mid March and we will order that today. He would prefer to come to the [facility] for his CT scan and a telemedicine visit for review of it.    He is encouraged to do self exams of his left shoulder and let us know if he notes any lumps or bumps in that area. For now he prefers remote follow-up visits which is adequate.    Orders: CT chest   Follow up: 2 months via telehealth

## 2020-06-13 ENCOUNTER — Ambulatory Visit
Admit: 2020-06-13 | Discharge: 2020-06-13 | Disposition: A | Discharge status: Home / Self Care | Payer: Medicare HMO | Attending: Diagnostic Radiology | Admitting: Diagnostic Radiology

## 2020-06-13 DIAGNOSIS — C499 Malignant neoplasm of connective and soft tissue, unspecified: Secondary | ICD-10-CM | POA: Insufficient documentation

## 2020-06-14 ENCOUNTER — Ambulatory Visit: Payer: Medicare HMO | Attending: Medical | Admitting: Medical

## 2020-06-14 ENCOUNTER — Encounter (HOSPITAL_BASED_OUTPATIENT_CLINIC_OR_DEPARTMENT_OTHER): Payer: Self-pay | Admitting: Medical

## 2020-06-14 ENCOUNTER — Telehealth (HOSPITAL_BASED_OUTPATIENT_CLINIC_OR_DEPARTMENT_OTHER): Payer: Medicare HMO | Admitting: Medical

## 2020-06-14 DIAGNOSIS — C499 Malignant neoplasm of connective and soft tissue, unspecified: Secondary | ICD-10-CM

## 2020-06-14 NOTE — Progress Notes (Signed)
This visit is being conducted over the telephone at the patients request: Yes  Patient gives verbal consent to proceed and knows there may be a copay/deductible: Yes    Time spent with patient/guardian on this telephone visit: 9 minutes    Given the importance of social distancing and other strategies recommended to reduce the risk of COVID-19 transmission, I am providing medical care to this patient via a telephone visit in place of an in person visit at the request of the patient.      Mark Poole called after his scheduled phone visit and requested scan review.  He had a CT of the chest yesterday here at the [facility].  The results were reviewed with him and this shows resolution of a previously seen 5 mm pulmonary nodule and no new or suspicious nodules.  There is a sizable right lung pleural effusion which is consistent with his prior scans and his diagnosis of congestive heart failure.    He does report that he is short of breath and out of shape and he has a cardiologist with a pending appointment in a week or so.  He also has an appointment coming up with his primary care doctor to follow-up on his anemia.    He periodically performs self examination on the left upper arm surgical site and has not noted any skin discolorations or masses.  He has no concerns with this area.    We will encourage the patient to do an in person visit and a CT of the chest in 6 months along with a clinical exam of the left upper extremity.  He agrees to this and I look forward to seeing him at that time.

## 2020-06-14 NOTE — Progress Notes (Signed)
Mr. Raschke did not cancel and was not present for a scheduled appointment today.  Disposition: Chart reviewed, patient contacted by phone regarding follow-up. There was no answer.   We will reschedule

## 2020-08-13 ENCOUNTER — Inpatient Hospital Stay: Payer: Self-pay

## 2020-08-22 DEATH — deceased

## 2020-11-06 ENCOUNTER — Encounter (HOSPITAL_BASED_OUTPATIENT_CLINIC_OR_DEPARTMENT_OTHER): Payer: Self-pay

## 2021-02-08 ENCOUNTER — Encounter (HOSPITAL_BASED_OUTPATIENT_CLINIC_OR_DEPARTMENT_OTHER): Payer: Self-pay | Admitting: Orthopaedic Surgery

## 2021-07-22 DEATH — deceased
